# Patient Record
Sex: Female | Born: 2000 | Race: Black or African American | Hispanic: No | Marital: Single | Smoking: Never smoker
Health system: Southern US, Community
[De-identification: ages and names within clinical notes are randomized; demographics above are authoritative.]

## PROBLEM LIST (undated history)

## (undated) DIAGNOSIS — J45909 Unspecified asthma, uncomplicated: Secondary | ICD-10-CM

## (undated) DIAGNOSIS — M069 Rheumatoid arthritis, unspecified: Secondary | ICD-10-CM

---
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
## 2018-03-27 NOTE — Progress Notes (Signed)
This encounter was created in error - please disregard.

## 2018-05-09 ENCOUNTER — Other Ambulatory Visit: Payer: Self-pay

## 2018-05-09 ENCOUNTER — Telehealth (INDEPENDENT_AMBULATORY_CARE_PROVIDER_SITE_OTHER): Payer: PRIVATE HEALTH INSURANCE | Admitting: Family Medicine

## 2018-05-09 ENCOUNTER — Telehealth: Payer: Self-pay | Admitting: Family Medicine

## 2018-05-09 DIAGNOSIS — R42 Dizziness and giddiness: Secondary | ICD-10-CM | POA: Diagnosis not present

## 2018-05-09 NOTE — Progress Notes (Signed)
Spoke with pt and she informed me that her appoinment today is for her current dizziness. She states this dizziness has been present for 1 week. Did have some N/V at first when it started but no N/V currently. She also states that she has noticed some blurry vision on and off since the dizziness started.    No travel outside the country.  Please call pt at number: [phone]

## 2018-05-09 NOTE — Progress Notes (Signed)
Telemedicine Encounter- SOAP NOTE Established Patient  I discussed the limitations, risks, security and privacy concerns of performing an evaluation and management service by telephone and the availability of in person appointments. I also discussed with the patient that there may be a patient responsible charge related to this service. The patient expressed understanding and agreed to proceed.  This telephone encounter was conducted with the patient's verbal consent via audio telecommunications: yes Patient was instructed to have this encounter in a suitably private space; and to only have persons present to whom they give permission to participate. pts mother was present during the call answering questions. In addition, patient identity was confirmed by use of name plus two identifiers (DOB and address).  I spent a total of talking with the patient and pts mother  Subjective   Spoke with pt and she informed me that her appoinment today is for her current dizziness. She states this dizziness has been present for 1 week. Did have some N/V at first when it started but no N/V currently. She also states that she has noticed some blurry vision on and off since the dizziness started.   HPI pt 18yo female established pt. No recent office visits in clinic. pt has been having chest pain, nausea, vomiting, headaches, dizziness with positional changes for 2 weeks ago.  No LOC. No h/o injury. No h/o concussion.  NO cough, sneezing, or fever.  Dizziness daily with positional changes. Chest pain episodically, no n/v currently associated with dizziness.  No diarrhea.  No regular medication.  LMP-2 week ago- normal. Pt with normal appetite-eats lighter at lunch-jello and pudding but normal breakfast and dinner.  Headaches episodically -not associated with dizziness.  Pt drinks water with meals. Tea and juice drank at least once a day.  No energy drinks.  Pt does not wear glasses or contacts.  pts eyes  were checked a few years ago-glasses given for long distance-not currently wearing.  Hearing loss-tinnitus-right.  No difficulty swallowing.  No abdominal pain.  No pain with urination..  No rashes. 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  No h/o diabetes.   No travel outside the country. No recent labwork-no anemia diagnosis in the past. Mother with sickle cell trait-pt does NOT have that diagnosis. Asthma- is table. No tob use.    Past Medical History:  Diagnosis Date   Asthma    RA (rheumatoid arthritis) (HCC)     No current outpatient medications on file.   No current facility-administered medications for this visit.     No Known Allergies Live with parents-nursing school Social History   Socioeconomic History   Marital status: Single    Spouse name: Not on file   Number of children: 0   Years of education: Not on file   Highest education level: Not on file  Occupational History   Not on file  Social Needs   Financial resource strain: Not on file   Food insecurity:    Worry: Not on file    Inability: Not on file   Transportation needs:    Medical: Not on file    Non-medical: Not on file  Tobacco Use   Smoking status: Never Smoker   Smokeless tobacco: Never Used  Substance and Sexual Activity   Alcohol use: No   Drug use: No   Sexual activity: Yes    Partners: Male    Comment: 1ST intercourse- 15, partners- 2   Lifestyle   Physical  activity:    Days per week: Not on file    Minutes per session: Not on file   Stress: Not on file  Relationships   Social connections:    Talks on phone: Not on file    Gets together: Not on file    Attends religious service: Not on file    Active member of club or organization: Not on file    Attends meetings of clubs or organizations: Not on file    Relationship status: Not on file   Intimate partner violence:    Fear of current or ex partner: Not on file    Emotionally abused: Not on  file    Physically abused: Not on file    Forced sexual activity: Not on file  Other Topics Concern   Not on file  Social History Narrative   Not on file   Mother has experienced vertigo and has colitis and DM ROS  CONSTITUTIONAL: no weight loss, fever-non recently,  EENT:chronic hearing loss, no sinus problems, no nasal congestion,  CV: no chest pain currently RESP: no SOB,or  Cough GI: no heartburn, constipation, diarrhea-nausea, vomiting noted with onset of dizziness-currently none NEURO: headaches-no relationship with dizziness, blurred vision associated with dizziness, no loss of consciousness, no episodes of visual loss, no seizure symptoms Objective   Pt has not taken vital signs and does not know baseline bp/pulse-   I discussed the assessment and treatment plan with the patient. The patient was provided an opportunity to ask questions and all were answered. The patient agreed with the plan and demonstrated an understanding of the instructions. Mother asked questions about COVID and current symptoms.   The patient was advised to call back or seek an in-person evaluation if the symptoms worsen or if the condition fails to improve as anticipated.  I provided of non-face-to-face time during this encounter.  Thai Burgueno Mat Carne, MD  Primary Care at [facility] 05-09-18

## 2018-05-09 NOTE — Telephone Encounter (Signed)
Copied from CRM [id number]. Topic: Quick Communication - See Telephone Encounter >> May 09, 2018  1:34 PM Louie Bun, Rosey Bath D wrote: CRM for notification. See Telephone encounter for: 05/09/18. Patient mom Unk Pinto called and would like to talk to Dr. Leretha Pol or her CMA before the patients virtual appointment. Please call mom back, thanks.

## 2018-05-09 NOTE — Patient Instructions (Signed)
D/w pt taking vs for baseline(pt has stethoscope and bp cuff at home) and with dizziness-compare Drink at least 8oz of water 5-6 times a day Regular meals with carb +protein component Schedule a face-to -face visit for evaluation and blood work-pt with no recent evaluation. Pt and parent understand if symptoms do not improve with improved fluids and food -clinic appt needed

## 2018-05-10 NOTE — Telephone Encounter (Signed)
Saw Dr. Jonette Mate yesterday.

## 2018-05-11 ENCOUNTER — Encounter: Payer: Self-pay | Admitting: *Deleted

## 2018-05-11 ENCOUNTER — Other Ambulatory Visit: Payer: Self-pay

## 2018-05-11 ENCOUNTER — Encounter (HOSPITAL_COMMUNITY): Payer: Self-pay | Admitting: *Deleted

## 2018-05-11 ENCOUNTER — Emergency Department (HOSPITAL_COMMUNITY)
Admission: EM | Admit: 2018-05-11 | Discharge: 2018-05-11 | Disposition: A | Discharge status: Home / Self Care | Payer: BLUE CROSS/BLUE SHIELD | Attending: Emergency Medicine | Admitting: Emergency Medicine

## 2018-05-11 ENCOUNTER — Emergency Department (HOSPITAL_COMMUNITY): Payer: BLUE CROSS/BLUE SHIELD

## 2018-05-11 ENCOUNTER — Telehealth: Payer: Self-pay | Admitting: Family Medicine

## 2018-05-11 DIAGNOSIS — R42 Dizziness and giddiness: Secondary | ICD-10-CM | POA: Insufficient documentation

## 2018-05-11 DIAGNOSIS — R5381 Other malaise: Secondary | ICD-10-CM | POA: Insufficient documentation

## 2018-05-11 DIAGNOSIS — R079 Chest pain, unspecified: Secondary | ICD-10-CM | POA: Diagnosis not present

## 2018-05-11 DIAGNOSIS — J45909 Unspecified asthma, uncomplicated: Secondary | ICD-10-CM | POA: Diagnosis not present

## 2018-05-11 LAB — CBC WITH DIFFERENTIAL/PLATELET
Abs Immature Granulocytes: 0.02 10*3/uL (ref 0.00–0.07)
Basophils Absolute: 0 10*3/uL (ref 0.0–0.1)
Basophils Relative: 0 %
Eosinophils Absolute: 0.4 10*3/uL (ref 0.0–0.5)
Eosinophils Relative: 4 %
HCT: 40.9 % (ref 36.0–46.0)
Hemoglobin: 13.7 g/dL (ref 12.0–15.0)
Immature Granulocytes: 0 %
Lymphocytes Relative: 30 %
Lymphs Abs: 2.8 10*3/uL (ref 0.7–4.0)
MCH: 29.1 pg (ref 26.0–34.0)
MCHC: 33.5 g/dL (ref 30.0–36.0)
MCV: 87 fL (ref 80.0–100.0)
Monocytes Absolute: 0.9 10*3/uL (ref 0.1–1.0)
Monocytes Relative: 10 %
Neutro Abs: 5.1 10*3/uL (ref 1.7–7.7)
Neutrophils Relative %: 56 %
Platelets: 302 10*3/uL (ref 150–400)
RBC: 4.7 MIL/uL (ref 3.87–5.11)
RDW: 12.2 % (ref 11.5–15.5)
WBC: 9.2 10*3/uL (ref 4.0–10.5)
nRBC: 0 % (ref 0.0–0.2)

## 2018-05-11 LAB — BASIC METABOLIC PANEL
Anion gap: 8 (ref 5–15)
BUN: 14 mg/dL (ref 6–20)
CO2: 24 mmol/L (ref 22–32)
Calcium: 9 mg/dL (ref 8.9–10.3)
Chloride: 106 mmol/L (ref 98–111)
Creatinine, Ser: 0.71 mg/dL (ref 0.44–1.00)
GFR calc Af Amer: >60 mL/min (ref >60)
GFR calc non Af Amer: >60 mL/min (ref >60)
Glucose, Bld: 79 mg/dL (ref 70–99)
Potassium: 3.5 mmol/L (ref 3.5–5.1)
Sodium: 138 mmol/L (ref 135–145)

## 2018-05-11 LAB — POC URINE PREG, ED: Preg Test, Ur: NEGATIVE

## 2018-05-11 LAB — TROPONIN I: Troponin I: <0.03 ng/mL (ref <0.03)

## 2018-05-11 NOTE — Telephone Encounter (Signed)
This encounter was created in error - please disregard.

## 2018-05-11 NOTE — ED Triage Notes (Signed)
Fatigue & Nausea & Dizziness x 2 weeks. Has had some vomiting last week. Midline chest pain radiates to the back, describes as "aching and tight when I inhale". No cough, fevers. Has had runny.

## 2018-05-11 NOTE — Telephone Encounter (Signed)
Pt saw Dr. Judee Clara 05/09/2018, dizziness. States she was told to call back if not feeling better. Pt states dizziness remains, "The same, no worse."  States positional, worse when sitting to standing, turning head. Has BP values as instructed.  Lying 122/66  Sitting 95/64 Standing 109/77  States has been staying hydrated, eating well as instructed. Also reports fatigue, mild mid CP, intermittent. States "Same as Tuesday." Per note pt was to call back for clinic appt if symptoms remain. Pt instructed to go to ED if symptoms worsen, verbalizes understanding.  Please advise regarding appt.  CB# [phone]

## 2018-05-11 NOTE — Discharge Instructions (Addendum)
Make sure that you are getting plenty of rest and drinking a lot of fluids.  You can take Tylenol, if needed for pain.  See your doctor if not better in 1 or 2 weeks.

## 2018-05-11 NOTE — ED Provider Notes (Signed)
[facility]-EMERGENCY DEPT Provider Note   CSN: [id number] Arrival date & time: 05/11/18  1952    History   Chief Complaint Chief Complaint  Patient presents with  . Dizziness    HPI Angela Stevenson is a 18 y.o. female.     HPI   She presents for evaluation of chest discomfort felt as a "tight feeling."  Pain is been present for 2 days and is worse when she takes a deep breath.  She denies fever, chills, cough, shortness of breath, weakness or dizziness.  An episode of vomiting, 1 week ago.  No known sick contacts.  He is not currently employed.  She is not currently taking any medications.  There are no other known modifying factors.  Past Medical History:  Diagnosis Date  . Asthma   . RA (rheumatoid arthritis) [facility](HCC)     Patient Active Problem List   Diagnosis Date Noted  . Dizziness and giddiness 05/09/2018    History reviewed. No pertinent surgical history.   OB History    Gravida  0   Para  0   Term  0   Preterm  0   AB  0   Living  0     SAB  0   TAB  0   Ectopic  0   Multiple  0   Live Births  0            Home Medications    Prior to Admission medications   Not on File    Family History Family History  Problem Relation Age of Onset  . Hypertension Father   . Cancer Maternal Grandmother        lung   . Cancer Paternal Grandmother   . Cancer Paternal Grandfather     Social History Social History   Tobacco Use  . Smoking status: Never Smoker  . Smokeless tobacco: Never Used  Substance Use Topics  . Alcohol use: No  . Drug use: No     Allergies   Patient has no known allergies.   Review of Systems Review of Systems  All other systems reviewed and are negative.    Physical Exam Updated Vital Signs BP 115/68 (BP Location: Left Arm)   Pulse 67   Temp 98 F (36.7 C) (Oral)   Resp 17   Ht 5\' 7"  (1.702 m)   Wt 59.4 kg   LMP 04/27/2018   SpO2 99%   BMI 20.52 kg/m   Physical Exam Vitals  signs and nursing note reviewed.  Constitutional:      General: She is not in acute distress.    Appearance: She is well-developed. She is not ill-appearing or diaphoretic.  HENT:     Head: Normocephalic and atraumatic.     Right Ear: External ear normal.     Left Ear: External ear normal.  Eyes:     Conjunctiva/sclera: Conjunctivae normal.     Pupils: Pupils are equal, round, and reactive to light.  Neck:     Musculoskeletal: Normal range of motion and neck supple.     Trachea: Phonation normal.  Cardiovascular:     Rate and Rhythm: Normal rate and regular rhythm.     Heart sounds: Normal heart sounds.  Pulmonary:     Effort: Pulmonary effort is normal. No respiratory distress.     Breath sounds: Normal breath sounds. No stridor. No rhonchi.  Chest:     Chest wall: No tenderness.  Abdominal:  Palpations: Abdomen is soft.     Tenderness: There is no abdominal tenderness.  Musculoskeletal: Normal range of motion.  Skin:    General: Skin is warm and dry.  Neurological:     Mental Status: She is alert and oriented to person, place, and time.     Cranial Nerves: No cranial nerve deficit.     Sensory: No sensory deficit.     Motor: No abnormal muscle tone.     Coordination: Coordination normal.  Psychiatric:        Mood and Affect: Mood normal.        Behavior: Behavior normal.        Thought Content: Thought content normal.        Judgment: Judgment normal.      ED Treatments / Results  Labs (all labs ordered are listed, but only abnormal results are displayed) Labs Reviewed  BASIC METABOLIC PANEL  TROPONIN I  CBC WITH DIFFERENTIAL/PLATELET  POC URINE PREG, ED    EKG EKG Interpretation  Date/Time:  Thursday May 11 2018 20:56:08 EDT Ventricular Rate:  71 PR Interval:    QRS Duration: 76 QT Interval:  368 QTC Calculation: 400 R Axis:   72 Text Interpretation:  Sinus rhythm No old tracing to compare Confirmed by Mancel Bale [phone]) on 05/11/2018 9:24:02  PM   Radiology Dg Chest 2 View  Result Date: 05/11/2018 CLINICAL DATA:  Fatigue for 2 weeks EXAM: CHEST - 2 VIEW COMPARISON:  None. FINDINGS: The heart size and mediastinal contours are within normal limits. Both lungs are clear. The visualized skeletal structures are unremarkable. IMPRESSION: No active cardiopulmonary disease. Electronically Signed   By: Alcide Clever M.D.   On: 05/11/2018 21:36    Procedures Procedures (including critical care time)  Medications Ordered in ED Medications - No data to display   Initial Impression / Assessment and Plan / ED Course  I have reviewed the triage vital signs and the nursing notes.  Pertinent labs & imaging results that were available during my care of the patient were reviewed by me and considered in my medical decision making (see chart for details).  Clinical Course as of May 10 2299  Thu May 11, 2018  2250 Normal  POC Urine Pregnancy, ED (not at [facility]) [EW]  2250 Normal  Basic metabolic panel [EW]  2251 normal  Troponin I - Once [EW]  2251 No infiltrate or CHF, images reviewed by me  DG Chest 2 View [EW]    Clinical Course User Index [EW] Mancel Bale, MD        Patient Vitals for the past 24 hrs:  BP Temp Temp src Pulse Resp SpO2 Height Weight  05/11/18 2255 115/68 - - 67 17 99 % - -  05/11/18 2011 - - - - - - 5\' 7"  (1.702 m) 59.4 kg  05/11/18 2000 130/71 98 F (36.7 C) Oral 80 16 99 % - -    10:51 PM Reevaluation with update and discussion. After initial assessment and treatment, an updated evaluation reveals no change in clinical status, findings discussed with the patient and all questions were answered. Mancel Bale   Medical Decision Making: Chest pain, ongoing for 2 weeks.  She has a component of musculoskeletal discomfort, as the pain is worse with deep breathing and movement.  Doubt ACS, CHF, pneumonia, serious bacterial infection or metabolic instability.  CRITICAL CARE-no Performed by: Mancel Bale   Nursing Notes Reviewed/ Care Coordinated Applicable Imaging Reviewed Interpretation of Laboratory Data incorporated  into ED treatment  The patient appears reasonably screened and/or stabilized for discharge and I doubt any other medical condition or other [facility] requiring further screening, evaluation, or treatment in the ED at this time prior to discharge.  Plan: Home Medications-routine over-the-counter antipyretic; Home Treatments-heat to affected area; return here if the recommended treatment, does not improve the symptoms; Recommended follow up-asymmetric.   Final Clinical Impressions(s) / ED Diagnoses   Final diagnoses:  Nonspecific chest pain  [facility]    ED Discharge Orders    None       Mancel Bale, MD 05/11/18 [phone]

## 2018-05-12 NOTE — Telephone Encounter (Signed)
Pt was seen and evaluated in the ER yesterday 05/11/2018 for concerns.

## 2018-10-31 ENCOUNTER — Other Ambulatory Visit: Payer: Self-pay

## 2018-10-31 ENCOUNTER — Ambulatory Visit (INDEPENDENT_AMBULATORY_CARE_PROVIDER_SITE_OTHER): Payer: PRIVATE HEALTH INSURANCE

## 2018-10-31 ENCOUNTER — Ambulatory Visit (INDEPENDENT_AMBULATORY_CARE_PROVIDER_SITE_OTHER): Payer: PRIVATE HEALTH INSURANCE | Admitting: Emergency Medicine

## 2018-10-31 ENCOUNTER — Encounter: Payer: Self-pay | Admitting: Emergency Medicine

## 2018-10-31 VITALS — BP 102/62 | HR 81 | Temp 98.4°F | Resp 16 | Ht 67.0 in | Wt 149.4 lb

## 2018-10-31 DIAGNOSIS — R002 Palpitations: Secondary | ICD-10-CM

## 2018-10-31 DIAGNOSIS — Z8739 Personal history of other diseases of the musculoskeletal system and connective tissue: Secondary | ICD-10-CM

## 2018-10-31 NOTE — Patient Instructions (Addendum)
     If you have lab work done today you will be contacted with your lab results within the next 2 weeks.  If you have not heard from us then please contact us. The fastest way to get your results is to register for My Chart.   IF you received an x-ray today, you will receive an invoice from [facility]. Please contact  Radiology at [phone] with questions or concerns regarding your invoice.   IF you received labwork today, you will receive an invoice from [facility]. Please contact [facility] at [phone] with questions or concerns regarding your invoice.   Our billing staff will not be able to assist you with questions regarding bills from these companies.  You will be contacted with the lab results as soon as they are available. The fastest way to get your results is to activate your My Chart account. Instructions are located on the last page of this paperwork. If you have not heard from us regarding the results in 2 weeks, please contact this office.      Palpitations Palpitations are feelings that your heartbeat is not normal. Your heartbeat may feel like it is:  Uneven.  Faster than normal.  Fluttering.  Skipping a beat. This is usually not a serious problem. In some cases, you may need tests to rule out any serious problems. Follow these instructions at home: Pay attention to any changes in your condition. Take these actions to help manage your symptoms: Eating and drinking  Avoid: ? Coffee, tea, soft drinks, and energy drinks. ? Chocolate. ? Alcohol. ? Diet pills. Lifestyle   Try to lower your stress. These things can help you relax: ? Yoga. ? Deep breathing and meditation. ? Exercise. ? Using words and images to create positive thoughts (guided imagery). ? Using your mind to control things in your body (biofeedback).  Do not use drugs.  Get plenty of rest and sleep. Keep a regular bed time. General instructions   Take  over-the-counter and prescription medicines only as told by your doctor.  Do not use any products that contain nicotine or tobacco, such as cigarettes and e-cigarettes. If you need help quitting, ask your doctor.  Keep all follow-up visits as told by your doctor. This is important. You may need more tests if palpitations do not go away or get worse. Contact a doctor if:  Your symptoms last more than 24 hours.  Your symptoms occur more often. Get help right away if you:  Have chest pain.  Feel short of breath.  Have a very bad headache.  Feel dizzy.  Pass out (faint). Summary  Palpitations are feelings that your heartbeat is uneven or faster than normal. It may feel like your heart is fluttering or skipping a beat.  Avoid food and drinks that may cause palpitations. These include caffeine, chocolate, and alcohol.  Try to lower your stress. Do not smoke or use drugs.  Get help right away if you faint or have chest pain, shortness of breath, a severe headache, or dizziness. This information is not intended to replace advice given to you by your health care provider. Make sure you discuss any questions you have with your health care provider. Document Released: 10/21/2007 Document Revised: 02/23/2017 Document Reviewed: 02/23/2017 Elsevier Patient Education  2020 [employer]  

## 2018-10-31 NOTE — Progress Notes (Signed)
Angela Stevenson 18 y.o.   Chief Complaint  Patient presents with  . Palpitations    x 3 weeks while sitting down    HISTORY OF PRESENT ILLNESS: This is a 18 y.o. female complaining of intermittent palpitations for the past 3 weeks.  No triggered events.  Usually last about 10 seconds with a "heavy heartbeat".  No prior similar episodes.  Denies near syncope or syncopal episodes.  Denies diaphoresis, lightheadedness, dizziness, nausea or vomiting, chest pain or shortness of breath, or any other associated symptoms.  Denies recent fever, chills or flulike symptoms.  Non-smoker.  No EtOH or drug abuser.  Healthy lifestyle.  Denies pregnancy. 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  In the past she was tried on methotrexate with bad side effects. Increased stress level 7-8 in a 1-10 scale.  HPI   Prior to Admission medications   Not on File    No Known Allergies  Patient Active Problem List   Diagnosis Date Noted  . Dizziness and giddiness 05/09/2018    Past Medical History:  Diagnosis Date  . Asthma   . RA (rheumatoid arthritis) (HCC)     History reviewed. No pertinent surgical history.  Social History   Socioeconomic History  . Marital status: Single    Spouse name: Not on file  . Number of children: 0  . Years of education: Not on file  . Highest education level: Not on file  Occupational History  . Not on file  Social Needs  . Financial resource strain: Not on file  . Food insecurity    Worry: Not on file    Inability: Not on file  . Transportation needs    Medical: Not on file    Non-medical: Not on file  Tobacco Use  . Smoking status: Never Smoker  . Smokeless tobacco: Never Used  Substance and Sexual Activity  . Alcohol use: No  . Drug use: No  . Sexual activity: Yes    Partners: Male    Comment: 1ST intercourse- 15, partners- 2   Lifestyle  . Physical activity    Days per week: Not on  file    Minutes per session: Not on file  . Stress: Not on file  Relationships  . Social Musician on phone: Not on file    Gets together: Not on file    Attends religious service: Not on file    Active member of club or organization: Not on file    Attends meetings of clubs or organizations: Not on file    Relationship status: Not on file  . Intimate partner violence    Fear of current or ex partner: Not on file    Emotionally abused: Not on file    Physically abused: Not on file    Forced sexual activity: Not on file  Other Topics Concern  . Not on file  Social History Narrative  . Not on file    Family History  Problem Relation Age of Onset  . Hypertension Father   . Cancer Maternal Grandmother        lung   . Cancer Paternal Grandmother   . Cancer Paternal Grandfather      Review of Systems  Constitutional: Negative.  Negative for chills and fever.  HENT: Negative for congestion and sore throat.   Eyes: Negative.   Respiratory: Negative.  Negative for cough and shortness of breath.   Cardiovascular: Positive  for palpitations. Negative for chest pain and leg swelling.  Gastrointestinal: Negative.  Negative for abdominal pain, diarrhea, nausea and vomiting.  Genitourinary: Negative.  Negative for dysuria and hematuria.  Musculoskeletal: Negative.  Negative for myalgias.  Skin: Negative.  Negative for rash.  Neurological: Negative for dizziness, speech change, focal weakness and headaches.  Endo/Heme/Allergies: Negative.   All other systems reviewed and are negative.  Vitals:   10/31/18 1613  BP: 102/62  Pulse: 81  Resp: 16  Temp: 98.4 F (36.9 C)  SpO2: 99%     Physical Exam Vitals signs reviewed.  Constitutional:      Appearance: Normal appearance.  HENT:     Head: Normocephalic.     Right Ear: Tympanic membrane, ear canal and external ear normal.     Left Ear: Tympanic membrane, ear canal and external ear normal.  Eyes:     Extraocular  Movements: Extraocular movements intact.     Conjunctiva/sclera: Conjunctivae normal.     Pupils: Pupils are equal, round, and reactive to light.  Cardiovascular:     Rate and Rhythm: Normal rate and regular rhythm.     Pulses: Normal pulses.     Heart sounds: Normal heart sounds.  Pulmonary:     Effort: Pulmonary effort is normal.     Breath sounds: Normal breath sounds.  Musculoskeletal: Normal range of motion.     Right lower leg: No edema.     Left lower leg: No edema.  Skin:    General: Skin is warm and dry.     Capillary Refill: Capillary refill takes less than 2 seconds.  Neurological:     General: No focal deficit present.     Mental Status: She is alert and oriented to person, place, and time.     Sensory: No sensory deficit.     Motor: No weakness.  Psychiatric:        Mood and Affect: Mood normal.        Behavior: Behavior normal.   EKG: Normal sinus rhythm with ventricular rate of 77.  No acute ischemic changes.  Normal EKG. Dg Chest 2 View  Result Date: 10/31/2018 CLINICAL DATA:  Palpitations. EXAM: CHEST - 2 VIEW COMPARISON:  05/11/2018 FINDINGS: Lungs are adequately inflated and otherwise clear. Cardiomediastinal silhouette and remainder of the exam is unchanged. IMPRESSION: No active cardiopulmonary disease. Electronically Signed   By: Marin Olp M.D.   On: 10/31/2018 17:02      ASSESSMENT & PLAN: Abel was seen today for palpitations.  Diagnoses and all orders for this visit:  Palpitations -     EKG 12-Lead -     CBC with Differential/Platelet -     Comprehensive metabolic panel -     Thyroid Panel With TSH -     DG Chest 2 View; Future -     Ambulatory referral to Cardiology  History of rheumatoid arthritis Comments: Autoimmune    Patient Instructions       If you have lab work done today you will be contacted with your lab results within the next 2 weeks.  If you have not heard from [place] then please contact us. The fastest way to get your  results is to register for My Chart.   IF you received an x-ray today, you will receive an invoice from [facility]. Please contact [facility] at [phone] with questions or concerns regarding your invoice.   IF you received labwork today, you will receive an invoice from [place]. Please  contact [facility] at [phone] with questions or concerns regarding your invoice.   Our billing staff will not be able to assist you with questions regarding bills from these companies.  You will be contacted with the lab results as soon as they are available. The fastest way to get your results is to activate your My Chart account. Instructions are located on the last page of this paperwork. If you have not heard from [place] regarding the results in 2 weeks, please contact this office.     Palpitations Palpitations are feelings that your heartbeat is not normal. Your heartbeat may feel like it is:  Uneven.  Faster than normal.  Fluttering.  Skipping a beat. This is usually not a serious problem. In some cases, you may need tests to rule out any serious problems. Follow these instructions at home: Pay attention to any changes in your condition. Take these actions to help manage your symptoms: Eating and drinking  Avoid: ? Coffee, tea, soft drinks, and energy drinks. ? Chocolate. ? Alcohol. ? Diet pills. Lifestyle   Try to lower your stress. These things can help you relax: ? Yoga. ? Deep breathing and meditation. ? Exercise. ? Using words and images to create positive thoughts (guided imagery). ? Using your mind to control things in your body (biofeedback).  Do not use drugs.  Get plenty of rest and sleep. Keep a regular bed time. General instructions   Take over-the-counter and prescription medicines only as told by your doctor.  Do not use any products that contain nicotine or tobacco, such as cigarettes and e-cigarettes. If you need help quitting, ask your  doctor.  Keep all follow-up visits as told by your doctor. This is important. You may need more tests if palpitations do not go away or get worse. Contact a doctor if:  Your symptoms last more than 24 hours.  Your symptoms occur more often. Get help right away if you:  Have chest pain.  Feel short of breath.  Have a very bad headache.  Feel dizzy.  Pass out (faint). Summary  Palpitations are feelings that your heartbeat is uneven or faster than normal. It may feel like your heart is fluttering or skipping a beat.  Avoid food and drinks that may cause palpitations. These include caffeine, chocolate, and alcohol.  Try to lower your stress. Do not smoke or use drugs.  Get help right away if you faint or have chest pain, shortness of breath, a severe headache, or dizziness. This information is not intended to replace advice given to you by your health care provider. Make sure you discuss any questions you have with your health care provider. Document Released: 10/21/2007 Document Revised: 02/23/2017 Document Reviewed: 02/23/2017 Elsevier Patient Education  2020 Elsevier Inc.      Edwina Barth, MD [facility]

## 2018-11-01 ENCOUNTER — Encounter: Payer: Self-pay | Admitting: Emergency Medicine

## 2018-11-01 LAB — CBC WITH DIFFERENTIAL/PLATELET
Basophils Absolute: 0.1 10*3/uL (ref 0.0–0.2)
Basos: 1 %
EOS (ABSOLUTE): 0.3 10*3/uL (ref 0.0–0.4)
Eos: 3 %
Hematocrit: 39 % (ref 34.0–46.6)
Hemoglobin: 13.3 g/dL (ref 11.1–15.9)
Immature Grans (Abs): 0 10*3/uL (ref 0.0–0.1)
Immature Granulocytes: 0 %
Lymphocytes Absolute: 2.7 10*3/uL (ref 0.7–3.1)
Lymphs: 30 %
MCH: 29.9 pg (ref 26.6–33.0)
MCHC: 34.1 g/dL (ref 31.5–35.7)
MCV: 88 fL (ref 79–97)
Monocytes Absolute: 0.7 10*3/uL (ref 0.1–0.9)
Monocytes: 7 %
Neutrophils Absolute: 5.4 10*3/uL (ref 1.4–7.0)
Neutrophils: 59 %
Platelets: 318 10*3/uL (ref 150–450)
RBC: 4.45 x10E6/uL (ref 3.77–5.28)
RDW: 12.4 % (ref 11.7–15.4)
WBC: 9.2 10*3/uL (ref 3.4–10.8)

## 2018-11-01 LAB — COMPREHENSIVE METABOLIC PANEL
ALT: 10 IU/L (ref 0–32)
AST: 15 IU/L (ref 0–40)
Albumin/Globulin Ratio: 1.4 (ref 1.2–2.2)
Albumin: 4.2 g/dL (ref 3.9–5.0)
Alkaline Phosphatase: 53 IU/L (ref 39–117)
BUN/Creatinine Ratio: 15 (ref 9–23)
BUN: 12 mg/dL (ref 6–20)
Bilirubin Total: 0.4 mg/dL (ref 0.0–1.2)
CO2: 22 mmol/L (ref 20–29)
Calcium: 9.3 mg/dL (ref 8.7–10.2)
Chloride: 102 mmol/L (ref 96–106)
Creatinine, Ser: 0.82 mg/dL (ref 0.57–1.00)
GFR calc Af Amer: 119 mL/min/{1.73_m2} (ref >59)
GFR calc non Af Amer: 103 mL/min/{1.73_m2} (ref >59)
Globulin, Total: 3.1 g/dL (ref 1.5–4.5)
Glucose: 83 mg/dL (ref 65–99)
Potassium: 4.3 mmol/L (ref 3.5–5.2)
Sodium: 138 mmol/L (ref 134–144)
Total Protein: 7.3 g/dL (ref 6.0–8.5)

## 2018-11-01 LAB — THYROID PANEL WITH TSH
Free Thyroxine Index: 1.7 (ref 1.2–4.9)
T3 Uptake Ratio: 28 % (ref 24–39)
T4, Total: 6.1 ug/dL (ref 4.5–12.0)
TSH: 0.64 u[IU]/mL (ref 0.450–4.500)

## 2018-11-27 ENCOUNTER — Telehealth: Payer: BLUE CROSS/BLUE SHIELD | Admitting: Cardiology

## 2018-11-27 ENCOUNTER — Telehealth: Payer: Self-pay

## 2018-11-27 NOTE — Telephone Encounter (Signed)
Attempted to contact pt x 3 for virtual appointment scheduled with Dr. Harrell Gave today 11/2. Left message for pt to call back to reschedule.

## 2018-12-01 ENCOUNTER — Telehealth: Payer: Self-pay | Admitting: *Deleted

## 2018-12-01 NOTE — Telephone Encounter (Signed)
A message was left, re: her new patient appointment. 

## 2018-12-14 ENCOUNTER — Encounter: Payer: Self-pay | Admitting: General Practice

## 2018-12-16 ENCOUNTER — Other Ambulatory Visit: Payer: Self-pay

## 2018-12-16 ENCOUNTER — Encounter (HOSPITAL_COMMUNITY): Payer: Self-pay

## 2018-12-16 ENCOUNTER — Emergency Department (HOSPITAL_COMMUNITY)
Admission: EM | Admit: 2018-12-16 | Discharge: 2018-12-17 | Disposition: A | Discharge status: Home / Self Care | Payer: Self-pay | Attending: Emergency Medicine | Admitting: Emergency Medicine

## 2018-12-16 DIAGNOSIS — Z20828 Contact with and (suspected) exposure to other viral communicable diseases: Secondary | ICD-10-CM | POA: Insufficient documentation

## 2018-12-16 DIAGNOSIS — J069 Acute upper respiratory infection, unspecified: Secondary | ICD-10-CM | POA: Insufficient documentation

## 2018-12-16 DIAGNOSIS — J45909 Unspecified asthma, uncomplicated: Secondary | ICD-10-CM | POA: Insufficient documentation

## 2018-12-16 LAB — GROUP A STREP BY PCR: Group A Strep by PCR: NOT DETECTED

## 2018-12-16 NOTE — Discharge Instructions (Addendum)
Please call your primary care provider, as needed.  Please get established with a PCP if you do not already have one.  Please take Tylenol or ibuprofen as needed for your chills and body aches.  Recommend cough drops, warm tea with honey, and other conservative methods for symptomatic relief of your sore throat.   If you live with, or provide care at home for, a person confirmed to have, or being evaluated for, COVID-19 infection please follow these guidelines to prevent infection:  Follow healthcare providers instructions Make sure that you understand and can help the patient follow any healthcare provider instructions for all care.  Provide for the patients basic needs You should help the patient with basic needs in the home and provide support for getting groceries, prescriptions, and other personal needs.  Monitor the patients symptoms If they are getting sicker, call his or her medical provider a  This will help the healthcare providers office take steps to keep other people from getting infected. Ask the healthcare provider to call the local or state health department.  Limit the number of people who have contact with the patient If possible, have only one caregiver for the patient. Other household members should stay in another home or place of residence. If this is not possible, they should stay in another room, or be separated from the patient as much as possible. Use a separate bathroom, if available. Restrict visitors who do not have an essential need to be in the home.  Keep older adults, very young children, and other sick people away from the patient Keep older adults, very young children, and those who have compromised immune systems or chronic health conditions away from the patient. This includes people with chronic heart, lung, or kidney conditions, diabetes, and cancer.  Ensure good ventilation Make sure that shared spaces in the home have good air flow, such as  from an air conditioner or an opened window, weather permitting.  Wash your hands often Wash your hands often and thoroughly with soap and water for at least 20 seconds. You can use an alcohol based hand sanitizer if soap and water are not available and if your hands are not visibly dirty. Avoid touching your eyes, nose, and mouth with unwashed hands. Use disposable paper towels to dry your hands. If not available, use dedicated cloth towels and replace them when they become wet.  Wear a facemask and gloves Wear a disposable facemask at all times in the room and gloves when you touch or have contact with the patients blood, body fluids, and/or secretions or excretions, such as sweat, saliva, sputum, nasal mucus, vomit, urine, or feces.  Ensure the mask fits over your nose and mouth tightly, and do not touch it during use. Throw out disposable facemasks and gloves after using them. Do not reuse. Wash your hands immediately after removing your facemask and gloves. If your personal clothing becomes contaminated, carefully remove clothing and launder. Wash your hands after handling contaminated clothing. Place all used disposable facemasks, gloves, and other waste in a lined container before disposing them with other household waste. Remove gloves and wash your hands immediately after handling these items.  Do not share dishes, glasses, or other household items with the patient Avoid sharing household items. You should not share dishes, drinking glasses, cups, eating utensils, towels, bedding, or other items After the person uses these items, you should wash them thoroughly with soap and water.  Wash laundry thoroughly Immediately remove and wash clothes  or bedding that have blood, body fluids, and/or secretions or excretions, such as sweat, saliva, sputum, nasal mucus, vomit, urine, or feces, on them. Wear gloves when handling laundry from the patient. Read and follow directions on labels of  laundry or clothing items and detergent. In general, wash and dry with the warmest temperatures recommended on the label.  Clean all areas the individual has used often Clean all touchable surfaces, such as counters, tabletops, doorknobs, bathroom fixtures, toilets, phones, keyboards, tablets, and bedside tables, every day. Also, clean any surfaces that may have blood, body fluids, and/or secretions or excretions on them. Wear gloves when cleaning surfaces the patient has come in contact with. Use a diluted bleach solution (e.g., dilute bleach with 1 part bleach and 10 parts water) or a household disinfectant with a label that says EPA-registered for coronaviruses. To make a bleach solution at home, add 1 tablespoon of bleach to 1 quart (4 cups) of water. For a larger supply, add  cup of bleach to 1 gallon (16 cups) of water. Read labels of cleaning products and follow recommendations provided on product labels. Labels contain instructions for safe and effective use of the cleaning product including precautions you should take when applying the product, such as wearing gloves or eye protection and making sure you have good ventilation during use of the product. Remove gloves and wash hands immediately after cleaning.  Monitor yourself for signs and symptoms of illness Caregivers and household members are considered close contacts, should monitor their health, and will be asked to limit movement outside of the home to the extent possible. Follow the monitoring steps for close contacts listed on the symptom monitoring form.   ? If you have additional questions, contact your local health department or call the epidemiologist on call at [phone] (available 24/7). ? This guidance is subject to change. For the most up-to-date guidance from [facility], please refer to their website: [email or web address]hu

## 2018-12-16 NOTE — ED Provider Notes (Signed)
[facility]-EMERGENCY DEPT Provider Note   CSN: [id number] Arrival date & time: 12/16/18  1804     History   Chief Complaint Chief Complaint  Patient presents with   Generalized Body Aches   Headache    HPI Carlise Florio is a 18 y.o. female with no relevant PMH presents to the ED with a 3-day history of chills, body aches, headache, runny nose, and sore throat.  She denies any shortness of breath or cough, chest pain, diminished appetite, inability to tolerate p.o., dizziness, hearing loss, ear pain, purulent nasal drainage, abdominal pain, nausea or vomiting, change in bowel habits, urinary symptoms, blurred vision, IVDA, numbness or tingling, or other neurologic deficits.  She has taken Tylenol which is helped with her headache.  She describes it as an insidious onset, roughly 6 out of 10 discomfort.  She is concerned primarily for COVID-19, but denies any obvious sick contacts.  She recently finished school and is not currently working.       HPI  Past Medical History:  Diagnosis Date   Asthma    RA (rheumatoid arthritis) (HCC)     Patient Active Problem List   Diagnosis Date Noted   History of rheumatoid arthritis 10/31/2018   Dizziness and giddiness 05/09/2018    History reviewed. No pertinent surgical history.   OB History    Gravida  0   Para  0   Term  0   Preterm  0   AB  0   Living  0     SAB  0   TAB  0   Ectopic  0   Multiple  0   Live Births  0            Home Medications    Prior to Admission medications   Medication Sig Start Date End Date Taking? Authorizing Provider  acetaminophen (TYLENOL) 325 MG tablet Take 650 mg by mouth every 6 (six) hours as needed for moderate pain.   Yes [provider]  Elderberry 575 MG/5ML SYRP Take 10 mLs by mouth once.   Yes [provider]  hydrocortisone cream 1 % Apply 1 application topically 2 (two) times daily as needed for itching.   Yes [provider]    Family History Family History  Problem Relation Age of Onset   Hypertension Father    Cancer Maternal Grandmother        lung    Cancer Paternal Grandmother    Cancer Paternal Grandfather     Social History Social History   Tobacco Use   Smoking status: Never Smoker   Smokeless tobacco: Never Used  Substance Use Topics   Alcohol use: No   Drug use: No     Allergies   Patient has no known allergies.   Review of Systems Review of Systems  All other systems reviewed and are negative.    Physical Exam Updated Vital Signs BP 125/79    Pulse 89    Temp 98.3 F (36.8 C) (Oral)    Resp 17    Ht 5\' 7"  (1.702 m)    Wt 65.8 kg    LMP 12/02/2018 (Approximate)    SpO2 100%    BMI 22.71 kg/m   Physical Exam Vitals signs and nursing note reviewed. Exam conducted with a chaperone present.  Constitutional:      Appearance: Normal appearance.  HENT:     Head: Normocephalic and atraumatic.     Comments: Sinuses nontender  to palpation.  Headache not exacerbated by leaning forward.    Mouth/Throat:     Comments: Patent oropharynx.  Mildly erythematous with clear drainage.  No tonsillar hypertrophy or exudates noted on exam.  No uvular deviation.  No soft palate or tongue swelling.  No masses appreciated. Eyes:     General: No scleral icterus.    Extraocular Movements: Extraocular movements intact.     Conjunctiva/sclera: Conjunctivae normal.     Pupils: Pupils are equal, round, and reactive to light.  Neck:     Musculoskeletal: Normal range of motion and neck supple. No neck rigidity or muscular tenderness.     Comments: No meningismus. Cardiovascular:     Rate and Rhythm: Normal rate and regular rhythm.     Pulses: Normal pulses.     Heart sounds: Normal heart sounds.  Pulmonary:     Effort: Pulmonary effort is normal. No respiratory distress.     Breath sounds: Normal breath sounds. No wheezing or rales.  Skin:    General: Skin is dry.    Neurological:     General: No focal deficit present.     Mental Status: She is alert and oriented to person, place, and time.     GCS: GCS eye subscore is 4. GCS verbal subscore is 5. GCS motor subscore is 6.     Cranial Nerves: No cranial nerve deficit.     Sensory: No sensory deficit.     Motor: No weakness.     Coordination: Coordination normal.     Gait: Gait normal.  Psychiatric:        Mood and Affect: Mood normal.        Behavior: Behavior normal.        Thought Content: Thought content normal.      ED Treatments / Results  Labs (all labs ordered are listed, but only abnormal results are displayed) Labs Reviewed  GROUP A STREP BY PCR  NOVEL CORONAVIRUS, NAA (HOSP ORDER, SEND-OUT TO REF LAB; TAT 18-24 HRS)    EKG None  Radiology No results found.  Procedures Procedures (including critical care time)  Medications Ordered in ED Medications - No data to display   Initial Impression / Assessment and Plan / ED Course  I have reviewed the triage vital signs and the nursing notes.  Pertinent labs & imaging results that were available during my care of the patient were reviewed by me and considered in my medical decision making (see chart for details).       Despite relatively benign oropharynx on physical exam, will obtain strep PCR testing to rule out bacterial pharyngitis.  She denies any decreased p.o. intake or difficulty swallowing as well as any abdominal discomfort, nausea, or vomiting.  She did however report that the sore throat preceded the runny nose and headache symptoms.  She has not taken any for her sore throat symptoms besides Tylenol.  Recommend that she try ibuprofen, cough drops, warm tea with honey, and other conservative methods for symptomatic relief.  Her headache is not concerning for [facility], ICH, Meningitis, or temporal arteritis. Pt is afebrile with no focal neuro deficits, nuchal rigidity, or change in vision.  Her headache has been slowly  progressive thunderclap presentation.  Not the worst headache of her life.  CT head not warranted.  Given her collection of symptoms, will also obtain COVID-19 testing.  She reports that is the primary reason for why she is here in the ED today for evaluation.  Encouraged her  to isolate while results are pending.  She lives with her parents, who has been asymptomatic.  Patient's history and physical exam is consistent with an upper respiratory infection, likely viral etiology.  Chest x-ray not necessary due to brevity of illness, lack of chest pain or shortness of breath symptoms, lack of cough, and afebrile.  Provided patient with strict return precautions including but not limited to fever chills uncontrolled with Tylenol or ibuprofen, respiratory difficulty, intractable nausea and vomiting, or any other new or worsening symptoms.    Angela Stevenson was evaluated in Emergency Department on 12/16/2018 for the symptoms described in the history of present illness. She was evaluated in the context of the global COVID-19 pandemic, which necessitated consideration that the patient might be at risk for infection with the SARS-CoV-2 virus that causes COVID-19. Institutional protocols and algorithms that pertain to the evaluation of patients at risk for COVID-19 are in a state of rapid change based on information released by regulatory bodies including the CDC and federal and state organizations. These policies and algorithms were followed during the patient's care in the ED.   Final Clinical Impressions(s) / ED Diagnoses   Final diagnoses:  Viral upper respiratory tract infection    ED Discharge Orders    None       Lorelee New, PA-C 12/16/18 2343    Cathren Laine, MD 12/17/18 [phone]

## 2018-12-16 NOTE — ED Triage Notes (Signed)
Pt presents with c/o body aches and a headache for several days. Pt also c/o nasal congestion.

## 2018-12-18 LAB — NOVEL CORONAVIRUS, NAA (HOSP ORDER, SEND-OUT TO REF LAB; TAT 18-24 HRS): SARS-CoV-2, NAA: NOT DETECTED

## 2018-12-26 ENCOUNTER — Ambulatory Visit: Payer: PRIVATE HEALTH INSURANCE | Admitting: Family Medicine

## 2019-03-27 ENCOUNTER — Other Ambulatory Visit: Payer: Self-pay

## 2019-03-29 ENCOUNTER — Other Ambulatory Visit: Payer: Self-pay

## 2019-03-29 ENCOUNTER — Ambulatory Visit (INDEPENDENT_AMBULATORY_CARE_PROVIDER_SITE_OTHER): Payer: PRIVATE HEALTH INSURANCE | Admitting: Obstetrics & Gynecology

## 2019-03-29 ENCOUNTER — Encounter: Payer: Self-pay | Admitting: Obstetrics & Gynecology

## 2019-03-29 VITALS — BP 118/70 | Ht 68.0 in | Wt 148.0 lb

## 2019-03-29 DIAGNOSIS — Z30011 Encounter for initial prescription of contraceptive pills: Secondary | ICD-10-CM | POA: Diagnosis not present

## 2019-03-29 DIAGNOSIS — Z113 Encounter for screening for infections with a predominantly sexual mode of transmission: Secondary | ICD-10-CM | POA: Diagnosis not present

## 2019-03-29 DIAGNOSIS — Z01419 Encounter for gynecological examination (general) (routine) without abnormal findings: Secondary | ICD-10-CM

## 2019-03-29 MED ORDER — NORETHIN ACE-ETH ESTRAD-FE 1-20 MG-MCG PO TABS: 1.0000 | ORAL_TABLET | Freq: Every day | ORAL | 4 refills | Status: DC

## 2019-03-29 NOTE — Progress Notes (Signed)
[place] 2000/12/14 [id number]   History:    19 y.o. G0 Single.  New recent boyfriend.  Graduated in Nursing.  RP:  Established patient presenting for annual gyn exam   HPI:  Menses regular normal.  No BTB.  No dysmenorrhea.  No pelvic pain.  No abnormal vaginal d/c.  No pain with IC.  Not using condoms.  Would like to start on BCPs.  Breasts wnl.  Mictions/BMs wnl.  BMI 22.5.   Past medical history,surgical history, family history and social history were all reviewed and documented in the EPIC chart.  Gynecologic History Patient's last menstrual period was 02/28/2019.  Obstetric History OB History  Gravida Para Term Preterm AB Living  0 0 0 0 0 0  SAB TAB Ectopic Multiple Live Births  0 0 0 0 0     ROS: A ROS was performed and pertinent positives and negatives are included in the history.  GENERAL: No fevers or chills. HEENT: No change in vision, no earache, sore throat or sinus congestion. NECK: No pain or stiffness. CARDIOVASCULAR: No chest pain or pressure. No palpitations. PULMONARY: No shortness of breath, cough or wheeze. GASTROINTESTINAL: No abdominal pain, nausea, vomiting or diarrhea, melena or bright red blood per rectum. GENITOURINARY: No urinary frequency, urgency, hesitancy or dysuria. MUSCULOSKELETAL: No joint or muscle pain, no back pain, no recent trauma. DERMATOLOGIC: No rash, no itching, no lesions. ENDOCRINE: No polyuria, polydipsia, no heat or cold intolerance. No recent change in weight. HEMATOLOGICAL: No anemia or easy bruising or bleeding. NEUROLOGIC: No headache, seizures, numbness, tingling or weakness. PSYCHIATRIC: No depression, no loss of interest in normal activity or change in sleep pattern.     Exam:   BP 118/70   Ht 5\' 8"  (1.727 m)   Wt 148 lb (67.1 kg)   LMP 02/28/2019   BMI 22.50 kg/m   Body mass index is 22.5 kg/m.  General appearance : Well developed well nourished female. No acute distress HEENT: Eyes: no retinal hemorrhage or  exudates,  Neck supple, trachea midline, no carotid bruits, no thyroidmegaly Lungs: Clear to auscultation, no rhonchi or wheezes, or rib retractions  Heart: Regular rate and rhythm, no murmurs or gallops Breast:Examined in sitting and supine position were symmetrical in appearance, no palpable masses or tenderness,  no skin retraction, no nipple inversion, no nipple discharge, no skin discoloration, no axillary or supraclavicular lymphadenopathy Abdomen: no palpable masses or tenderness, no rebound or guarding Extremities: no edema or skin discoloration or tenderness  Pelvic: Vulva: Normal             Vagina: No gross lesions or discharge  Cervix: No gross lesions or discharge.  Pap reflex done.  Gono-Chlam done.  Uterus  AV, normal size, shape and consistency, non-tender and mobile  Adnexa  Without masses or tenderness  Anus: Normal   Assessment/Plan:  19 y.o. female for annual exam   1. Encounter for routine gynecological examination with Papanicolaou smear of cervix Normal gynecologic exam.  Pap reflex done.  Breast exam normal.  Good body mass index at 22.5.  Continue with fitness and healthy nutrition.  2. Encounter for initial prescription of contraceptive pills Counseling on contraception.  Decision to start on birth control pills.  No contraindication.  Usage, risks and benefits reviewed.  The generic of Loestrin Fe 1/20 prescribed.  3. Screen for STD (sexually transmitted disease) Condom use recommended. - Gono-Chlam on Pap - HIV antibody (with reflex) - RPR - Hepatitis C Antibody - Hepatitis  B Surface AntiGEN  Other orders - norethindrone-ethinyl estradiol (LOESTRIN FE) 1-20 MG-MCG tablet; Take 1 tablet by mouth daily.  [name] MD, 3:38 PM 03/29/2019

## 2019-03-30 LAB — RPR: RPR Ser Ql: NONREACTIVE

## 2019-03-30 LAB — HEPATITIS B SURFACE ANTIGEN: Hepatitis B Surface Ag: NONREACTIVE

## 2019-03-30 LAB — HEPATITIS C ANTIBODY
Hepatitis C Ab: NONREACTIVE
SIGNAL TO CUT-OFF: 0.02 (ref <1.00)

## 2019-03-30 LAB — HIV ANTIBODY (ROUTINE TESTING W REFLEX): HIV 1&2 Ab, 4th Generation: NONREACTIVE

## 2019-04-04 ENCOUNTER — Encounter: Payer: Self-pay | Admitting: Obstetrics & Gynecology

## 2019-04-04 LAB — PAP THINPREP ASCUS RFLX HPV RFLX TYPE
C. trachomatis RNA, TMA: NOT DETECTED
N. gonorrhoeae RNA, TMA: NOT DETECTED

## 2019-04-04 LAB — HUMAN PAPILLOMAVIRUS, HIGH RISK: HPV DNA High Risk: DETECTED — AB

## 2019-04-04 NOTE — Patient Instructions (Signed)
1. Encounter for routine gynecological examination with Papanicolaou smear of cervix Normal gynecologic exam.  Pap reflex done.  Breast exam normal.  Good body mass index at 22.5.  Continue with fitness and healthy nutrition.  2. Encounter for initial prescription of contraceptive pills Counseling on contraception.  Decision to start on birth control pills.  No contraindication.  Usage, risks and benefits reviewed.  The generic of Loestrin Fe 1/20 prescribed.  3. Screen for STD (sexually transmitted disease) Condom use recommended. - Gono-Chlam on Pap - HIV antibody (with reflex) - RPR - Hepatitis C Antibody - Hepatitis B Surface AntiGEN  Other orders - norethindrone-ethinyl estradiol (LOESTRIN FE) 1-20 MG-MCG tablet; Take 1 tablet by mouth daily.  Angela Stevenson, it was a pleasure seeing you today!  I will inform you of your results as soon as they are available.

## 2019-04-18 ENCOUNTER — Encounter: Payer: PRIVATE HEALTH INSURANCE | Admitting: Obstetrics & Gynecology

## 2019-05-02 ENCOUNTER — Ambulatory Visit: Payer: PRIVATE HEALTH INSURANCE | Admitting: Obstetrics & Gynecology

## 2019-05-15 ENCOUNTER — Encounter: Payer: PRIVATE HEALTH INSURANCE | Admitting: Obstetrics & Gynecology

## 2019-07-31 ENCOUNTER — Encounter: Payer: Self-pay | Admitting: Obstetrics & Gynecology

## 2019-07-31 ENCOUNTER — Ambulatory Visit: Payer: PRIVATE HEALTH INSURANCE | Admitting: Obstetrics & Gynecology

## 2019-07-31 ENCOUNTER — Other Ambulatory Visit: Payer: Self-pay

## 2019-07-31 VITALS — BP 112/70

## 2019-07-31 DIAGNOSIS — Z30011 Encounter for initial prescription of contraceptive pills: Secondary | ICD-10-CM | POA: Diagnosis not present

## 2019-07-31 DIAGNOSIS — R87810 Cervical high risk human papillomavirus (HPV) DNA test positive: Secondary | ICD-10-CM | POA: Diagnosis not present

## 2019-07-31 DIAGNOSIS — R87610 Atypical squamous cells of undetermined significance on cytologic smear of cervix (ASC-US): Secondary | ICD-10-CM

## 2019-07-31 DIAGNOSIS — N870 Mild cervical dysplasia: Secondary | ICD-10-CM | POA: Diagnosis not present

## 2019-07-31 MED ORDER — NORETHIN ACE-ETH ESTRAD-FE 1-20 MG-MCG PO TABS: 1.0000 | ORAL_TABLET | Freq: Every day | ORAL | 4 refills | Status: DC

## 2019-07-31 NOTE — Progress Notes (Signed)
    Angela Stevenson [birth year] [id number]        19 y.o.  G0P0000 Single  RP: ASCUS/HPV HR positive for Colposcopy  HPI: Pap 03/30/2019 ASCUS/HPV HR positive.  Gono-Chlam Negative 03/30/2019.  Using condoms for contraception, but had to take the morning after pill 2 wks ago, had a withdrawal bleeding after.   OB History  Gravida Para Term Preterm AB Living  0 0 0 0 0 0  SAB TAB Ectopic Multiple Live Births  0 0 0 0 0    Past medical history,surgical history, problem list, medications, allergies, family history and social history were all reviewed and documented in the EPIC chart.   Directed ROS with pertinent positives and negatives documented in the history of present illness/assessment and plan.  Exam:  There were no vitals filed for this visit. General appearance:  Normal  Colposcopy Procedure Note Angela Stevenson 07/31/2019  Indications:  ASCUS/HPV HR positive  Procedure Details  The risks and benefits of the procedure and Verbal informed consent obtained.  Speculum placed in vagina and excellent visualization of cervix achieved, cervix swabbed x 3 with acetic acid solution.  Findings:  Cervix colposcopy: Physical Exam Genitourinary:       Vaginal colposcopy: Normal  Vulvar colposcopy: Normal  Perirectal colposcopy: Normal  The cervix was sprayed with Hurricane before performing the cervical biopsies.  Specimens: HPV 16-18-45.  Cervical Bxs 12 and 6 O'Clock  Complications: None, good hemostasis with Silver Nitrate . Plan:  Management per HPV 16-18-45 and Cervical Bx results  UPT Negative   Assessment/Plan:  19 y.o. G0P0000   1. 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  Colposcopy procedure explained to patient.  Colposcopy findings reviewed.  We will manage according to results of HPV 16-18-45 and per cervical biopsy results.  Postprocedure precautions reviewed. - Pathology Report ([facility]) - HPV Type 16 and 18/45  RNA  2. Encounter for initial prescription of contraceptive pills Patient had to take the morning after pill 2 weeks ago, had a withdrawal bleeding after.  Urine pregnancy test negative today.  Will start on birth control pills now.  Benefits and risks reviewed.  Usage reviewed and prescription sent to pharmacy. - Pregnancy, urine  Other orders - norethindrone-ethinyl estradiol (LOESTRIN FE) 1-20 MG-MCG tablet; Take 1 tablet by mouth daily.  [name] MD, 11:56 AM 07/31/2019

## 2019-08-01 ENCOUNTER — Encounter: Payer: Self-pay | Admitting: Obstetrics & Gynecology

## 2019-08-01 LAB — PREGNANCY, URINE: Preg Test, Ur: NEGATIVE

## 2019-08-01 LAB — HPV TYPE 16 AND 18/45 RNA
HPV Type 16 RNA: NOT DETECTED
HPV Type 18/45 RNA: NOT DETECTED

## 2019-08-01 NOTE — Patient Instructions (Signed)
1. 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  Colposcopy procedure explained to patient.  Colposcopy findings reviewed.  We will manage according to results of HPV 16-18-45 and per cervical biopsy results.  Postprocedure precautions reviewed. - Pathology Report ([facility]) - HPV Type 16 and 18/45 RNA  2. Encounter for initial prescription of contraceptive pills Patient had to take the morning after pill 2 weeks ago, had a withdrawal bleeding after.  Urine pregnancy test negative today.  Will start on birth control pills now.  Benefits and risks reviewed.  Usage reviewed and prescription sent to pharmacy. - Pregnancy, urine  Other orders - norethindrone-ethinyl estradiol (LOESTRIN FE) 1-20 MG-MCG tablet; Take 1 tablet by mouth daily.  Angela Stevenson, it was a pleasure seeing you today!  I will inform you of your results as soon as they are available.

## 2019-08-02 LAB — PATHOLOGY REPORT

## 2019-08-02 LAB — TISSUE PATH REPORT

## 2019-10-18 ENCOUNTER — Ambulatory Visit: Payer: PRIVATE HEALTH INSURANCE | Admitting: Obstetrics and Gynecology

## 2019-10-18 ENCOUNTER — Other Ambulatory Visit: Payer: Self-pay

## 2019-10-18 ENCOUNTER — Encounter: Payer: Self-pay | Admitting: Obstetrics and Gynecology

## 2019-10-18 VITALS — BP 106/70

## 2019-10-18 DIAGNOSIS — N3001 Acute cystitis with hematuria: Secondary | ICD-10-CM | POA: Diagnosis not present

## 2019-10-18 DIAGNOSIS — R300 Dysuria: Secondary | ICD-10-CM

## 2019-10-18 MED ORDER — PHENAZOPYRIDINE HCL 200 MG PO TABS: 200.0000 mg | ORAL_TABLET | Freq: Three times a day (TID) | ORAL | 0 refills | Status: AC | PRN

## 2019-10-18 MED ORDER — SULFAMETHOXAZOLE-TRIMETHOPRIM 800-160 MG PO TABS: 1.0000 | ORAL_TABLET | Freq: Two times a day (BID) | ORAL | 0 refills | Status: AC

## 2019-10-18 NOTE — Progress Notes (Signed)
   Angela Stevenson 2000/03/07 [id number]  SUBJECTIVE:  19 y.o. G0P0000 female presents for evaluation of urinary symptoms. Past few days she has had a cloudy urine with increasing dysuria, urinary frequency, and malodorous urine.  Just started her period today.  Had a UTI in the past with similar symptoms.  Current Outpatient Medications  Medication Sig Dispense Refill  . norethindrone-ethinyl estradiol (LOESTRIN FE) 1-20 MG-MCG tablet Take 1 tablet by mouth daily. (Patient not taking: Reported on 10/18/2019) 84 tablet 4   No current facility-administered medications for this visit.   Allergies: Patient has no known allergies.  Patient's last menstrual period was 10/18/2019.  Past medical history,surgical history, problem list, medications, allergies, family history and social history were all reviewed and documented as reviewed in the EPIC chart.  ROS: Pertinent positives and negatives as reviewed in HPI.   OBJECTIVE:  BP 106/70   LMP 10/18/2019  The patient appears well, alert, oriented, in no distress.  Urinalysis: 40-60 WBC, 20-40 RBC, 6-10 squamous epithelial cells/hpf, 3+ leukocyte esterase, negative nitrite  ASSESSMENT:  19 y.o. G0P0000 here with acute cystitis  PLAN:  Send urine for culture Rx sent for Bactrim DS twice daily x5 days and Pyridium 200 mg 3 times daily as needed for more immediate relief   Theresia Majors MD 10/18/19

## 2019-10-21 LAB — URINE CULTURE
MICRO NUMBER:: 10987586
SPECIMEN QUALITY:: ADEQUATE

## 2019-10-21 LAB — URINALYSIS, COMPLETE W/RFL CULTURE
Bilirubin Urine: NEGATIVE
Glucose, UA: NEGATIVE
Hyaline Cast: NONE SEEN /LPF
Ketones, ur: NEGATIVE
Nitrites, Initial: NEGATIVE
Protein, ur: NEGATIVE
Specific Gravity, Urine: 1.015 (ref 1.001–1.03)
pH: 7 (ref 5.0–8.0)

## 2019-10-21 LAB — CULTURE INDICATED

## 2020-01-17 ENCOUNTER — Encounter: Payer: Self-pay | Admitting: Family Medicine

## 2020-01-17 ENCOUNTER — Telehealth (INDEPENDENT_AMBULATORY_CARE_PROVIDER_SITE_OTHER): Payer: PRIVATE HEALTH INSURANCE | Admitting: Family Medicine

## 2020-01-17 DIAGNOSIS — T7840XA Allergy, unspecified, initial encounter: Secondary | ICD-10-CM | POA: Diagnosis not present

## 2020-01-17 DIAGNOSIS — R059 Cough, unspecified: Secondary | ICD-10-CM | POA: Diagnosis not present

## 2020-01-17 DIAGNOSIS — J45909 Unspecified asthma, uncomplicated: Secondary | ICD-10-CM | POA: Diagnosis not present

## 2020-01-17 MED ORDER — ALBUTEROL SULFATE HFA 108 (90 BASE) MCG/ACT IN AERS: 2.0000 | INHALATION_SPRAY | Freq: Four times a day (QID) | RESPIRATORY_TRACT | 2 refills | Status: DC | PRN

## 2020-01-17 MED ORDER — CETIRIZINE HCL 10 MG PO TABS: 10.0000 mg | ORAL_TABLET | Freq: Every day | ORAL | 3 refills | Status: DC

## 2020-01-17 MED ORDER — FLUTICASONE PROPIONATE 50 MCG/ACT NA SUSP: 2.0000 | Freq: Every day | NASAL | 6 refills | Status: DC

## 2020-01-17 NOTE — Patient Instructions (Addendum)
Asthma, Adult  Asthma is a long-term (chronic) condition in which the airways get tight and narrow. The airways are the breathing passages that lead from the nose and mouth down into the lungs. A person with asthma will have times when symptoms get worse. These are called asthma attacks. They can cause coughing, whistling sounds when you breathe (wheezing), shortness of breath, and chest pain. They can make it hard to breathe. There is no cure for asthma, but medicines and lifestyle changes can help control it. There are many things that can bring on an asthma attack or make asthma symptoms worse (triggers). Common triggers include:  Mold.  Dust.  Cigarette smoke.  Cockroaches.  Things that can cause allergy symptoms (allergens). These include animal skin flakes (dander) and pollen from trees or grass.  Things that pollute the air. These may include household cleaners, wood smoke, smog, or chemical odors.  Cold air, weather changes, and wind.  Crying or laughing hard.  Stress.  Certain medicines or drugs.  Certain foods such as dried fruit, potato chips, and grape juice.  Infections, such as a cold or the flu.  Certain medical conditions or diseases.  Exercise or tiring activities. Asthma may be treated with medicines and by staying away from the things that cause asthma attacks. Types of medicines may include:  Controller medicines. These help prevent asthma symptoms. They are usually taken every day.  Fast-acting reliever or rescue medicines. These quickly relieve asthma symptoms. They are used as needed and provide short-term relief.  Allergy medicines if your attacks are brought on by allergens.  Medicines to help control the body's defense (immune) system. Follow these instructions at home: Avoiding triggers in your home  Change your heating and air conditioning filter often.  Limit your use of fireplaces and wood stoves.  Get rid of pests (such as roaches and  mice) and their droppings.  Throw away plants if you see mold on them.  Clean your floors. Dust regularly. Use cleaning products that do not smell.  Have someone vacuum when you are not home. Use a vacuum cleaner with a HEPA filter if possible.  Replace carpet with wood, tile, or vinyl flooring. Carpet can trap animal skin flakes and dust.  Use allergy-proof pillows, mattress covers, and box spring covers.  Wash bed sheets and blankets every week in hot water. Dry them in a dryer.  Keep your bedroom free of any triggers.  Avoid pets and keep windows closed when things that cause allergy symptoms are in the air.  Use blankets that are made of polyester or cotton.  Clean bathrooms and kitchens with bleach. If possible, have someone repaint the walls in these rooms with mold-resistant paint. Keep out of the rooms that are being cleaned and painted.  Wash your hands often with soap and water. If soap and water are not available, use hand sanitizer.  Do not allow anyone to smoke in your home. General instructions  Take over-the-counter and prescription medicines only as told by your doctor. ? Talk with your doctor if you have questions about how or when to take your medicines. ? Make note if you need to use your medicines more often than usual.  Do not use any products that contain nicotine or tobacco, such as cigarettes and e-cigarettes. If you need help quitting, ask your doctor.  Stay away from secondhand smoke.  Avoid doing things outdoors when allergen counts are high and when air quality is low.  Wear a ski mask   when doing outdoor activities in the winter. The mask should cover your nose and mouth. Exercise indoors on cold days if you can.  Warm up before you exercise. Take time to cool down after exercise.  Use a peak flow meter as told by your doctor. A peak flow meter is a tool that measures how well the lungs are working.  Keep track of the peak flow meter's readings.  Write them down.  Follow your asthma action plan. This is a written plan for taking care of your asthma and treating your attacks.  Make sure you get all the shots (vaccines) that your doctor recommends. Ask your doctor about a flu shot and a pneumonia shot.  Keep all follow-up visits as told by your doctor. This is important. Contact a doctor if:  You have wheezing, shortness of breath, or a cough even while taking medicine to prevent attacks.  The mucus you cough up (sputum) is thicker than usual.  The mucus you cough up changes from clear or white to yellow, green, gray, or bloody.  You have problems from the medicine you are taking, such as: ? A rash. ? Itching. ? Swelling. ? Trouble breathing.  You need reliever medicines more than 2-3 times a week.  Your peak flow reading is still at 50-79% of your personal best after following the action plan for 1 hour.  You have a fever. Get help right away if:  You seem to be worse and are not responding to medicine during an asthma attack.  You are short of breath even at rest.  You get short of breath when doing very little activity.  You have trouble eating, drinking, or talking.  You have chest pain or tightness.  You have a fast heartbeat.  Your lips or fingernails start to turn blue.  You are light-headed or dizzy, or you faint.  Your peak flow is less than 50% of your personal best.  You feel too tired to breathe normally. Summary  Asthma is a long-term (chronic) condition in which the airways get tight and narrow. An asthma attack can make it hard to breathe.  Asthma cannot be cured, but medicines and lifestyle changes can help control it.  Make sure you understand how to avoid triggers and how and when to use your medicines. This information is not intended to replace advice given to you by your health care provider. Make sure you discuss any questions you have with your health care provider. Document Revised:  03/16/2018 Document Reviewed: 02/16/2016 Elsevier Patient Education  2020 Elsevier Inc. Allergies, Adult An allergy means that your body reacts to something that bothers it (allergen). It is not a normal reaction. This can happen from something that you:  Eat.  Breathe in.  Touch. You can have an allergy (be allergic) to:  Outdoor things, like: ? Pollen. ? Grass. ? Weeds.  Indoor things, like: ? Dust. ? Smoke. ? Pet dander.  Foods.  Medicines.  Things that bother your skin, like: ? Detergents. ? Chemicals. ? Latex.  Perfume.  Bugs. An allergy cannot spread from person to person (is not contagious). Follow these instructions at home:         Stay away from things that you know you are allergic to.  If you have allergies to things in the air, wash out your nose each day. Do it with one of these: ? A salt-water (saline) spray. ? A container (neti pot).  Take over-the-counter and prescription medicines only as  told by your doctor.  Keep all follow-up visits as told by your doctor. This is important.  If you are at risk for a very bad allergy reaction (anaphylaxis), keep an auto-injector with you all the time. This is called an epinephrine injection. ? This is pre-measured medicine with a needle. You can put it into your skin by yourself. ? Right after you have a very bad allergy reaction, you or a person with you must give the medicine in less than a few minutes. This is an emergency.  If you have ever had a very bad allergy reaction, wear a medical alert bracelet or necklace. Your very bad allergy should be written on it. Contact a health care provider if:  Your symptoms do not get better with treatment. Get help right away if:  You have symptoms of a very bad allergy reaction. These include: ? A swollen mouth, tongue, or throat. ? Pain or tightness in your chest. ? Trouble breathing. ? Being short of breath. ? Dizziness. ? Fainting. ? Very bad pain  in your belly (abdomen). ? Throwing up (vomiting). ? Watery poop (diarrhea). Summary  An allergy means that your body reacts to something that bothers it (allergen). It is not a normal reaction.  Stay away from things that make your body react.  Take over-the-counter and prescription medicines only as told by your doctor.  If you are at risk for a very bad allergy reaction, carry an auto-injector (epinephrine injection) all the time. Also, wear a medical alert bracelet or necklace so people know about your allergy. This information is not intended to replace advice given to you by your health care provider. Make sure you discuss any questions you have with your health care provider. Document Revised: 05/02/2018 Document Reviewed: 04/26/2016 Elsevier Patient Education  [employer].   If you have lab work done today you will be contacted with your lab results within the next 2 weeks.  If you have not heard from [place] then please contact us. The fastest way to get your results is to register for My Chart.   IF you received an x-ray today, you will receive an invoice from [facility]. Please contact [facility] at [phone] with questions or concerns regarding your invoice.   IF you received labwork today, you will receive an invoice from [facility]. Please contact [facility] at [phone] with questions or concerns regarding your invoice.   Our billing staff will not be able to assist you with questions regarding bills from these companies.  You will be contacted with the lab results as soon as they are available. The fastest way to get your results is to activate your My Chart account. Instructions are located on the last page of this paperwork. If you have not heard from [place] regarding the results in 2 weeks, please contact this office.

## 2020-01-17 NOTE — Progress Notes (Signed)
Virtual Visit Note  I connected with patient on 01/17/20 at 1340 by telephone due to unable to work Epic video visit and verified that I am speaking with the correct person using two identifiers. Angela Stevenson is currently located at home and no family members are currently with them during visit. The provider, Azalee Course Jakyria Bleau, FNP is located in their office at time of visit.  I discussed the limitations, risks, security and privacy concerns of performing an evaluation and management service by telephone and the availability of in person appointments. I also discussed with the patient that there may be a patient responsible charge related to this service. The patient expressed understanding and agreed to proceed.   I provided 20 minutes of non-face-to-face time during this encounter.  Chief Complaint  Patient presents with  . Cough    Pt reports dry cough for 1 month COVID test at start was negative does have another scheduled. Pt reports coughs through morning okay through afternoon     HPI ? BF was sick months ago Covid test was negative as was hers a month ago Cough in the morning when she wakes up Worse when taking a deep breath Lasts until about noon Did have asthma as a child No longer takes any inhalers Recently got a new happy   No Known Allergies  Prior to Admission medications   Medication Sig Start Date End Date Taking? Authorizing Provider  norethindrone-ethinyl estradiol (LOESTRIN FE) 1-20 MG-MCG tablet Take 1 tablet by mouth daily. Patient not taking: Reported on 01/17/2020 07/31/19   [name], MD    Past Medical History:  Diagnosis Date  . Asthma   . RA (rheumatoid arthritis) (HCC)     History reviewed. No pertinent surgical history.  Social History   Tobacco Use  . Smoking status: Never Smoker  . Smokeless tobacco: Never Used  Substance Use Topics  . Alcohol use: No    Family History  Problem Relation Age of Onset  . Hypertension Father    . Cancer Maternal Grandmother        lung   . Cancer Paternal Grandmother   . Cancer Paternal Grandfather     Review of Systems  HENT: Positive for congestion. Negative for sinus pain and sore throat.   Eyes: Negative for discharge and redness.  Respiratory: Positive for cough (in the AM). Negative for sputum production, shortness of breath and wheezing.        Doesn't snore  Cardiovascular: Negative for chest pain.  Gastrointestinal: Positive for nausea. Negative for abdominal pain, heartburn and vomiting.       Denies bad taste in mouth, or a burning sensation    Objective  Constitutional:      General: She is not in acute distress.    Appearance: Normal appearance. She is not ill-appearing.   Pulmonary:     Effort: Pulmonary effort is normal. No respiratory distress.  Neurological:     Mental Status: She is alert and oriented to person, place, and time.  Psychiatric:        Mood and Affect: Mood normal.        Behavior: Behavior normal.     ASSESSMENT and PLAN  Problem List Items Addressed This Visit   None   Visit Diagnoses    Allergy, initial encounter    -  Primary   Relevant Medications   cetirizine (ZYRTEC) 10 MG tablet nightly   fluticasone (FLONASE) 50 MCG/ACT nasal spray daily   Uncomplicated asthma,  unspecified asthma severity, unspecified whether persistent       Relevant Medications   albuterol (VENTOLIN HFA) 108 (90 Base) MCG/ACT inhaler As needed for cough    Cough      Discussed step up treatment of treating allergies and using inhaler for cough Will follow up and RTC precautions provided R/se/b of medications discussed Discussed conservative management      Return in about 2 months (around 03/19/2020).   The above assessment and management plan was discussed with the patient. The patient verbalized understanding of and has agreed to the management plan. Patient is aware to call the clinic if symptoms persist or worsen. Patient is aware when  to return to the clinic for a follow-up visit. Patient educated on when it is appropriate to go to the emergency department.     Macario Carls Karessa Onorato, FNP-BC Primary [facility] at [facility] [address] [place], [place] [address] Ph.  [phone] Fax [phone]

## 2020-01-29 ENCOUNTER — Ambulatory Visit: Payer: Self-pay

## 2020-01-29 ENCOUNTER — Ambulatory Visit: Payer: Self-pay | Attending: Internal Medicine

## 2020-01-29 ENCOUNTER — Other Ambulatory Visit: Payer: Self-pay

## 2020-01-29 DIAGNOSIS — Z23 Encounter for immunization: Secondary | ICD-10-CM

## 2020-01-29 NOTE — Progress Notes (Signed)
   Covid-19 Vaccination Clinic  Name:  Angela Stevenson    MRN: [id number] DOB: 2000/06/20  01/29/2020  [name] was observed post Covid-19 immunization for 15 minutes without incident. She was provided with Vaccine Information Sheet and instruction to access the V-Safe system.   [name] was instructed to call 911 with any severe reactions post vaccine: Marland Kitchen Difficulty breathing  . Swelling of face and throat  . A fast heartbeat  . A bad rash all over body  . Dizziness and weakness   Immunizations Administered    Name Date Dose VIS Date Route   Pfizer COVID-19 Vaccine 01/29/2020  1:41 PM 0.3 mL 11/14/2019 Intramuscular   Manufacturer: [employer], [employer]   Lot: [id number]   NDC: [address]-7

## 2020-02-01 ENCOUNTER — Telehealth: Payer: Self-pay | Admitting: Family Medicine

## 2020-02-11 ENCOUNTER — Ambulatory Visit: Payer: PRIVATE HEALTH INSURANCE | Admitting: Obstetrics & Gynecology

## 2020-02-19 ENCOUNTER — Ambulatory Visit: Payer: Self-pay

## 2020-02-19 ENCOUNTER — Ambulatory Visit: Payer: Self-pay | Attending: Internal Medicine

## 2020-02-19 ENCOUNTER — Ambulatory Visit: Payer: Self-pay | Admitting: *Deleted

## 2020-02-19 DIAGNOSIS — Z23 Encounter for immunization: Secondary | ICD-10-CM

## 2020-02-19 NOTE — Progress Notes (Signed)
   Covid-19 Vaccination Clinic  Name:  Angela Stevenson    MRN: [id number] DOB: September 15, 2000  02/19/2020  Angela Stevenson was observed post Covid-19 immunization for 15 minutes without incident. She was provided with Vaccine Information Sheet and instruction to access the V-Safe system.   Angela Stevenson was instructed to call 911 with any severe reactions post vaccine: Marland Kitchen Difficulty breathing  . Swelling of face and throat  . A fast heartbeat  . A bad rash all over body  . Dizziness and weakness   Immunizations Administered    Name Date Dose VIS Date Route   PFIZER Comrnaty(Gray TOP) Covid-19 Vaccine 02/19/2020  1:50 PM 0.3 mL 01/03/2020 Intramuscular   Manufacturer: [employer], [employer]   Lot: [id number]   NDC: [phone]

## 2020-02-19 NOTE — Telephone Encounter (Signed)
Pt needs telemed for COVID like symptoms with 2 negative tests she is aware she needs to be seen in ED if worsens or she has trouble breathing.  Thank you!

## 2020-02-19 NOTE — Telephone Encounter (Signed)
2nd call to NT today. Call ended on 1 st call and patient returned call.  C/o covid symptoms but testing negative x 2. Patient unable to remember the dates of testing. Exposed to boyfriend who was positive for covid over 2 weeks ago. C/o continued runny nose, coughing, sometimes productive cough with yellow sputum. C/o  OTC mucinex tried and patient does not feel better. C/o SOB when laying down and reports she is less SOB with movement. Encouraged patient to drink plenty of fluids and deep breath and cough exercises. Encouraged to retest for covid. Patient reports she has had symptoms for weeks.  Instructed patient to call PCP today and see if clinic will set her up with a new provider. Her PCP has left the practice. Care advise given. Patient verbalized understanding of care advise and to call back or go to ED if symptoms worsen.   Reason for Disposition . HIGH RISK for severe COVID complications (e.g., age > 64 years, obesity with BMI > 25, pregnant, chronic lung disease or other chronic medical condition)  (Exception: Already seen by PCP and no new or worsening symptoms.)  Answer Assessment - Initial Assessment Questions 1. COVID-19 DIAGNOSIS: "Who made your COVID-19 diagnosis?" "Was it confirmed by a positive lab test?" If not diagnosed by a HCP, ask "Are there lots of cases (community spread) where you live?" Note: See public health department website, if unsure.     2 covid tests negative per patient report 2. COVID-19 EXPOSURE: "Was there any known exposure to COVID before the symptoms began?" CDC Definition of close contact: within 6 feet (2 meters) for a total of 15 minutes or more over a 24-hour period.      Boyfriend greater than 2 weeks ago  3. ONSET: "When did the COVID-19 symptoms start?"      Few weeks ago  4. WORST SYMPTOM: "What is your worst symptom?" (e.g., cough, fever, shortness of breath, muscle aches)     Cough SOB 5. COUGH: "Do you have a cough?" If Yes, ask: "How bad is the  cough?"       Yes productive yellow sputum 6. FEVER: "Do you have a fever?" If Yes, ask: "What is your temperature, how was it measured, and when did it start?"     no 7. RESPIRATORY STATUS: "Describe your breathing?" (e.g., shortness of breath, wheezing, unable to speak)      SOB 8. BETTER-SAME-WORSE: "Are you getting better, staying the same or getting worse compared to yesterday?"  If getting worse, ask, "In what way?"     Same and a little worse 9. HIGH RISK DISEASE: "Do you have any chronic medical problems?" (e.g., asthma, heart or lung disease, weak immune system, obesity, etc.)     Asthma  10. VACCINE: "Have you gotten the COVID-19 vaccine?" If Yes ask: "Which one, how many shots, when did you get it?"       Pfizer 01/29/20 1st vaccine only.  11. PREGNANCY: "Is there any chance you are pregnant?" "When was your last menstrual period?"       na 12. OTHER SYMPTOMS: "Do you have any other symptoms?"  (e.g., chills, fatigue, headache, loss of smell or taste, muscle pain, sore throat; new loss of smell or taste especially support the diagnosis of COVID-19)       Cough, runny nose,SOB, productive cough  Protocols used: CORONAVIRUS (COVID-19) DIAGNOSED OR SUSPECTED-A-AH

## 2020-02-19 NOTE — Telephone Encounter (Signed)
Saw sagardia 10/2018

## 2020-02-19 NOTE — Telephone Encounter (Signed)
Called pt and sch appt. Pt is not est with a provider here at PCP. Can see pt for acute visit.

## 2020-02-19 NOTE — Telephone Encounter (Signed)
Patient called stating that she has tested negative for COVID-19 2 weeks aago she has been told her symptoms are allergy related and had OV. She called because she is finding that she has cough and SOB at night.  She is producing mucus.  Patient ended call stating she had incoming call that she needed to take Triage was not complete.  She states she will call back

## 2020-02-20 ENCOUNTER — Telehealth (INDEPENDENT_AMBULATORY_CARE_PROVIDER_SITE_OTHER): Payer: PRIVATE HEALTH INSURANCE | Admitting: Family Medicine

## 2020-02-20 ENCOUNTER — Encounter: Payer: Self-pay | Admitting: Family Medicine

## 2020-02-20 ENCOUNTER — Other Ambulatory Visit: Payer: Self-pay

## 2020-02-20 DIAGNOSIS — J45909 Unspecified asthma, uncomplicated: Secondary | ICD-10-CM

## 2020-02-20 DIAGNOSIS — T7840XA Allergy, unspecified, initial encounter: Secondary | ICD-10-CM

## 2020-02-20 MED ORDER — FLUTICASONE PROPIONATE 50 MCG/ACT NA SUSP: 2.0000 | Freq: Every day | NASAL | 6 refills | Status: DC

## 2020-02-20 MED ORDER — ALBUTEROL SULFATE HFA 108 (90 BASE) MCG/ACT IN AERS: 2.0000 | INHALATION_SPRAY | Freq: Four times a day (QID) | RESPIRATORY_TRACT | 2 refills | Status: AC | PRN

## 2020-02-20 MED ORDER — CETIRIZINE HCL 10 MG PO TABS: 10.0000 mg | ORAL_TABLET | Freq: Every day | ORAL | 3 refills | Status: DC

## 2020-02-20 NOTE — Progress Notes (Signed)
Virtual Visit Note  I connected with patient on 02/20/20 at 1341 by telephone due to unable to work Epic video visit and verified that I am speaking with the correct person using two identifiers. Angela Stevenson is currently located at home and no family members are currently with them during visit. The provider, Azalee Course Bronsen Serano, FNP is located in their office at time of visit.  I discussed the limitations, risks, security and privacy concerns of performing an evaluation and management service by telephone and the availability of in person appointments. I also discussed with the patient that there may be a patient responsible charge related to this service. The patient expressed understanding and agreed to proceed.   I provided 20 minutes of non-face-to-face time during this encounter.  Chief Complaint  Patient presents with  . Cough    Both dry and productive worse is S/o/b at night causing insomnia , going on 3 to 4 weeks, took all 3 covid tests negative but living with someone who had covid /now negative - she would like if a chest XR is needed , taking mucinex    HPI ? Allergy/Asthma Had prescribed: Cetrizine nightly, Daily flonase, Albuterol as needed  Currently Taking Mucinex and Benadryl Didn't have the money for the other medication Never picked up the medication    Cough is worse at night when lays down Wheezing continues  Allergies  Allergen Reactions  . Mushroom Extract Complex Swelling    Numbness And redness     Prior to Admission medications   Medication Sig Start Date End Date Taking? Authorizing Provider  albuterol (VENTOLIN HFA) 108 (90 Base) MCG/ACT inhaler Inhale 2 puffs into the lungs every 6 (six) hours as needed for wheezing or shortness of breath. 01/17/20   Domanique Huesman, Azalee Course, FNP  cetirizine (ZYRTEC) 10 MG tablet Take 1 tablet (10 mg total) by mouth at bedtime. 01/17/20   Logen Heintzelman, Azalee Course, FNP  fluticasone (FLONASE) 50 MCG/ACT nasal spray Place 2 sprays into  both nostrils daily. 01/17/20   Lauran Romanski, Azalee Course, FNP  norethindrone-ethinyl estradiol (LOESTRIN FE) 1-20 MG-MCG tablet Take 1 tablet by mouth daily. Patient not taking: Reported on 01/17/2020 07/31/19   [name], MD    Past Medical History:  Diagnosis Date  . Asthma   . RA (rheumatoid arthritis) (HCC)     History reviewed. No pertinent surgical history.  Social History   Tobacco Use  . Smoking status: Never Smoker  . Smokeless tobacco: Never Used  Substance Use Topics  . Alcohol use: No    Family History  Problem Relation Age of Onset  . Hypertension Father   . Cancer Maternal Grandmother        lung   . Cancer Paternal Grandmother   . Cancer Paternal Grandfather     Review of Systems  Constitutional: Negative for chills, fever, malaise/fatigue and weight loss.  HENT: Negative for congestion, ear discharge, ear pain, sinus pain and sore throat.   Respiratory: Positive for cough and wheezing. Negative for sputum production and shortness of breath.   Cardiovascular: Negative for chest pain and palpitations.  Gastrointestinal: Negative for abdominal pain, constipation, diarrhea, heartburn, nausea and vomiting.  Musculoskeletal: Negative for myalgias.  Neurological: Negative for focal weakness, weakness and headaches.    Objective  Constitutional:      General: Not in acute distress.    Appearance: Normal appearance. Not ill-appearing.   Pulmonary:     Effort: Pulmonary effort is normal. No respiratory distress.  Neurological:  Mental Status: Alert and oriented to person, place, and time.  Psychiatric:        Mood and Affect: Mood normal.        Behavior: Behavior normal.     ASSESSMENT and PLAN  Problem List Items Addressed This Visit   None   Visit Diagnoses    Uncomplicated asthma, unspecified asthma severity, unspecified whether persistent       Relevant Medications   albuterol (VENTOLIN HFA) 108 (90 Base) MCG/ACT inhaler   Allergy, initial  encounter       Relevant Medications   cetirizine (ZYRTEC) 10 MG tablet   fluticasone (FLONASE) 50 MCG/ACT nasal spray      Plan . Take cetrizine nightly, flonase in the AM . Use albuterol for wheezing as needed  Return if symptoms worsen or fail to improve.   The above assessment and management plan was discussed with the patient. The patient verbalized understanding of and has agreed to the management plan. Patient is aware to call the clinic if symptoms persist or worsen. Patient is aware when to return to the clinic for a follow-up visit. Patient educated on when it is appropriate to go to the emergency department.     Macario Carls Staysha Truby, FNP-BC Primary Care at [facility] [address] [place], [place] [address] Ph.  [phone] Fax [phone]

## 2020-02-20 NOTE — Patient Instructions (Addendum)
Asthma, Adult  Asthma is a long-term (chronic) condition in which the airways get tight and narrow. The airways are the breathing passages that lead from the nose and mouth down into the lungs. A person with asthma will have times when symptoms get worse. These are called asthma attacks. They can cause coughing, whistling sounds when you breathe (wheezing), shortness of breath, and chest pain. They can make it hard to breathe. There is no cure for asthma, but medicines and lifestyle changes can help control it. There are many things that can bring on an asthma attack or make asthma symptoms worse (triggers). Common triggers include:  Mold.  Dust.  Cigarette smoke.  Cockroaches.  Things that can cause allergy symptoms (allergens). These include animal skin flakes (dander) and pollen from trees or grass.  Things that pollute the air. These may include household cleaners, wood smoke, smog, or chemical odors.  Cold air, weather changes, and wind.  Crying or laughing hard.  Stress.  Certain medicines or drugs.  Certain foods such as dried fruit, potato chips, and grape juice.  Infections, such as a cold or the flu.  Certain medical conditions or diseases.  Exercise or tiring activities. Asthma may be treated with medicines and by staying away from the things that cause asthma attacks. Types of medicines may include:  Controller medicines. These help prevent asthma symptoms. They are usually taken every day.  Fast-acting reliever or rescue medicines. These quickly relieve asthma symptoms. They are used as needed and provide short-term relief.  Allergy medicines if your attacks are brought on by allergens.  Medicines to help control the body's defense (immune) system. Follow these instructions at home: Avoiding triggers in your home  Change your heating and air conditioning filter often.  Limit your use of fireplaces and wood stoves.  Get rid of pests (such as roaches and  mice) and their droppings.  Throw away plants if you see mold on them.  Clean your floors. Dust regularly. Use cleaning products that do not smell.  Have someone vacuum when you are not home. Use a vacuum cleaner with a HEPA filter if possible.  Replace carpet with wood, tile, or vinyl flooring. Carpet can trap animal skin flakes and dust.  Use allergy-proof pillows, mattress covers, and box spring covers.  Wash bed sheets and blankets every week in hot water. Dry them in a dryer.  Keep your bedroom free of any triggers.  Avoid pets and keep windows closed when things that cause allergy symptoms are in the air.  Use blankets that are made of polyester or cotton.  Clean bathrooms and kitchens with bleach. If possible, have someone repaint the walls in these rooms with mold-resistant paint. Keep out of the rooms that are being cleaned and painted.  Wash your hands often with soap and water. If soap and water are not available, use hand sanitizer.  Do not allow anyone to smoke in your home. General instructions  Take over-the-counter and prescription medicines only as told by your doctor. ? Talk with your doctor if you have questions about how or when to take your medicines. ? Make note if you need to use your medicines more often than usual.  Do not use any products that contain nicotine or tobacco, such as cigarettes and e-cigarettes. If you need help quitting, ask your doctor.  Stay away from secondhand smoke.  Avoid doing things outdoors when allergen counts are high and when air quality is low.  Wear a ski  mask when doing outdoor activities in the winter. The mask should cover your nose and mouth. Exercise indoors on cold days if you can.  Warm up before you exercise. Take time to cool down after exercise.  Use a peak flow meter as told by your doctor. A peak flow meter is a tool that measures how well the lungs are working.  Keep track of the peak flow meter's readings.  Write them down.  Follow your asthma action plan. This is a written plan for taking care of your asthma and treating your attacks.  Make sure you get all the shots (vaccines) that your doctor recommends. Ask your doctor about a flu shot and a pneumonia shot.  Keep all follow-up visits as told by your doctor. This is important. Contact a doctor if:  You have wheezing, shortness of breath, or a cough even while taking medicine to prevent attacks.  The mucus you cough up (sputum) is thicker than usual.  The mucus you cough up changes from clear or white to yellow, green, gray, or bloody.  You have problems from the medicine you are taking, such as: ? A rash. ? Itching. ? Swelling. ? Trouble breathing.  You need reliever medicines more than 2-3 times a week.  Your peak flow reading is still at 50-79% of your personal best after following the action plan for 1 hour.  You have a fever. Get help right away if:  You seem to be worse and are not responding to medicine during an asthma attack.  You are short of breath even at rest.  You get short of breath when doing very little activity.  You have trouble eating, drinking, or talking.  You have chest pain or tightness.  You have a fast heartbeat.  Your lips or fingernails start to turn blue.  You are light-headed or dizzy, or you faint.  Your peak flow is less than 50% of your personal best.  You feel too tired to breathe normally. Summary  Asthma is a long-term (chronic) condition in which the airways get tight and narrow. An asthma attack can make it hard to breathe.  Asthma cannot be cured, but medicines and lifestyle changes can help control it.  Make sure you understand how to avoid triggers and how and when to use your medicines. This information is not intended to replace advice given to you by your health care provider. Make sure you discuss any questions you have with your health care provider. Document Revised:  05/16/2019 Document Reviewed: 05/16/2019 Elsevier Patient Education  2021 Elsevier Inc.  Allergies, Adult An allergy means that your body reacts to something that bothers it (allergen). This can happen from something that you eat, breathe in, or touch. Allergies often affect the nose, eyes, skin, and stomach. They can be mild, moderate, or very bad (severe). An allergy cannot spread from person to person. They can happen at any age. Sometimes, people outgrow them. What are the causes?  Outdoor things, such as pollen, car fumes, and mold.  Indoor things, such as dust, smoke, mold, and pets.  Foods.  Medicines.  Things that bother your skin, such as perfume and bug bites. What increases the risk?  Having family members with allergies or asthma. What are the signs or symptoms? Symptoms depend on how bad your allergy is. Mild to moderate symptoms  Runny nose, stuffy nose, or sneezing.  Itchy mouth, ears, or throat.  A feeling of mucus dripping down the back of your throat.  Sore throat.  Eyes that are itchy, red, watery, or puffy.  A skin rash, or red, swollen areas of skin (hives).  Stomach cramps or bloating. Severe symptoms Very bad allergies to food, medicine, or bug bites may cause a very bad allergy reaction (anaphylaxis). This can be life-threatening. Symptoms include:  A red face.  Wheezing or coughing.  Swollen lips, tongue, or mouth.  Tight or swollen throat.  Chest pain or tightness, or a fast heartbeat.  Trouble breathing or shortness of breath.  Pain in your belly (abdomen), vomiting, or watery poop (diarrhea).  Feeling dizzy or fainting. How is this treated? Treatment for this condition depends on your symptoms. Treatment may include:  Cold, wet cloths for itching and swelling.  Eye drops, nose sprays, or skin creams.  Washing out your nose each day.  A humidifier.  Medicines.  A change to the foods you eat.  Being exposed again and  again to tiny amounts of allergens. This helps your body get used to them. You might have: ? Allergy shots. ? Very small amounts of allergen put under your tongue.  An emergency shot (auto-injector pen) if you have a very bad allergy reaction. ? This is a medicine with a needle. You can put it into your skin by yourself. ? Your doctor will teach you how to use it.      Follow these instructions at home: Medicines  Take or apply over-the-counter and prescription medicines only as told by your doctor.  If you are at risk for a very bad allergy reaction, keep an auto-injector pen with you all the time.   Eating and drinking  Follow instructions from your doctor about what to eat and drink.  Drink enough fluid to keep your pee (urine) pale yellow. General instructions  If you have ever had a very bad allergy reaction, wear a medical alert bracelet or necklace.  Stay away from things that you are allergic to.  Keep all follow-up visits as told by your doctor. This is important. Contact a doctor if:  Your symptoms do not get better with treatment. Get help right away if:  You have symptoms of a very bad allergy reaction. These include: ? A swollen mouth, tongue, or throat. ? Pain or tightness in your chest. ? Trouble breathing. ? Being short of breath. ? Dizziness. ? Fainting. ? Very bad pain in your belly. ? Vomiting. ? Watery poop. These symptoms may be an emergency. Do not wait to see if the symptoms will go away. Get medical help right away. Call your local emergency services (911 in the [place] Do not drive yourself to the hospital. Summary  Take or apply over-the-counter and prescription medicines only as told by your doctor.  Stay away from things you are allergic to.  If you are at risk for a very bad allergy reaction, carry an auto-injector pen all the time.  Wear a medical alert bracelet or necklace.  Very bad allergy reactions can be life-threatening. Get help  right away. This information is not intended to replace advice given to you by your health care provider. Make sure you discuss any questions you have with your health care provider. Document Revised: 11/22/2018 Document Reviewed: 11/22/2018 Elsevier Patient Education  2021 [employer].    If you have lab work done today you will be contacted with your lab results within the next 2 weeks.  If you have not heard from [place] then please contact us. The fastest way to get  your results is to register for My Chart.   IF you received an x-ray today, you will receive an invoice from [facility]. Please contact [facility] at [phone] with questions or concerns regarding your invoice.   IF you received labwork today, you will receive an invoice from [facility]. Please contact [facility] at [phone] with questions or concerns regarding your invoice.   Our billing staff will not be able to assist you with questions regarding bills from these companies.  You will be contacted with the lab results as soon as they are available. The fastest way to get your results is to activate your My Chart account. Instructions are located on the last page of this paperwork. If you have not heard from [place] regarding the results in 2 weeks, please contact this office.

## 2020-02-22 ENCOUNTER — Encounter: Payer: Self-pay | Admitting: Obstetrics & Gynecology

## 2020-02-22 ENCOUNTER — Other Ambulatory Visit: Payer: Self-pay

## 2020-02-22 ENCOUNTER — Ambulatory Visit (INDEPENDENT_AMBULATORY_CARE_PROVIDER_SITE_OTHER): Payer: PRIVATE HEALTH INSURANCE | Admitting: Obstetrics & Gynecology

## 2020-02-22 VITALS — BP 110/82 | HR 92 | Resp 18

## 2020-02-22 DIAGNOSIS — R87610 Atypical squamous cells of undetermined significance on cytologic smear of cervix (ASC-US): Secondary | ICD-10-CM

## 2020-02-22 DIAGNOSIS — N870 Mild cervical dysplasia: Secondary | ICD-10-CM | POA: Diagnosis not present

## 2020-02-22 DIAGNOSIS — Z113 Encounter for screening for infections with a predominantly sexual mode of transmission: Secondary | ICD-10-CM | POA: Diagnosis not present

## 2020-02-22 DIAGNOSIS — R87810 Cervical high risk human papillomavirus (HPV) DNA test positive: Secondary | ICD-10-CM

## 2020-02-22 NOTE — Progress Notes (Signed)
    Angela Stevenson January 11, 2001 [id number]        20 y.o.  G0 Stable boyfriend  RP: ASCUS/HPV HR/CIN 1 for Repeat Pap  HPI: ASCUS/HPV HR pos 03/2019.  Colpo 07/2019 CIN 1.  HPV 16-18-45 Neg.  Well on BCPs.  No pelvic pain.  Normal vaginal secretions.  No fever.   OB History  Gravida Para Term Preterm AB Living  0 0 0 0 0 0  SAB IAB Ectopic Multiple Live Births  0 0 0 0 0    Past medical history,surgical history, problem list, medications, allergies, family history and social history were all reviewed and documented in the EPIC chart.   Directed ROS with pertinent positives and negatives documented in the history of present illness/assessment and plan.  Exam:  Vitals:   02/22/20 1440  BP: 110/82  Pulse: 92  Resp: 18   General appearance:  Normal  Abdomen: Normal  Gynecologic exam: Vulva normal.  Speculum: Cervix and vagina normal.  Pap test with high-risk HPV and gonorrhea and Chlamydia done.   Assessment/Plan:  20 y.o. G0P0000   1. ASCUS with positive high risk HPV cervical ASCUs/HPV HR pos 03/2019.  Colpo 07/2019 CIN 1.  HPV 16-18-45 Neg.  Repeat Pap/HPV HR done today.  Management per results.  Follow-up annual gynecologic exam May/June 2022.  2. Dysplasia of cervix, low grade (CIN 1) Pap/HPV HR done.  3. Screen for STD (sexually transmitted disease) Gono-Chlam done.  [name] MD, 2:59 PM 02/22/2020

## 2020-02-22 NOTE — Addendum Note (Signed)
Addended by: Leda Min on: 02/22/2020 03:07 PM   Modules accepted: Orders

## 2020-02-25 LAB — PAP IG, CT-NG NAA, HPV HIGH-RISK
C. trachomatis RNA, TMA: NOT DETECTED
HPV DNA High Risk: NOT DETECTED
N. gonorrhoeae RNA, TMA: NOT DETECTED

## 2020-03-29 ENCOUNTER — Emergency Department (HOSPITAL_COMMUNITY): Payer: BC Managed Care – PPO

## 2020-03-29 ENCOUNTER — Emergency Department (HOSPITAL_COMMUNITY)
Admission: EM | Admit: 2020-03-29 | Discharge: 2020-03-29 | Disposition: A | Discharge status: Home / Self Care | Payer: BC Managed Care – PPO | Attending: Emergency Medicine | Admitting: Emergency Medicine

## 2020-03-29 ENCOUNTER — Encounter (HOSPITAL_COMMUNITY): Payer: Self-pay | Admitting: *Deleted

## 2020-03-29 ENCOUNTER — Ambulatory Visit (HOSPITAL_COMMUNITY)
Admission: EM | Admit: 2020-03-29 | Discharge: 2020-03-29 | Disposition: A | Discharge status: Home / Self Care | Payer: Self-pay | Attending: Medical Oncology | Admitting: Medical Oncology

## 2020-03-29 ENCOUNTER — Encounter (HOSPITAL_COMMUNITY): Payer: Self-pay | Admitting: Emergency Medicine

## 2020-03-29 ENCOUNTER — Other Ambulatory Visit: Payer: Self-pay

## 2020-03-29 DIAGNOSIS — R509 Fever, unspecified: Secondary | ICD-10-CM

## 2020-03-29 DIAGNOSIS — K047 Periapical abscess without sinus: Secondary | ICD-10-CM | POA: Diagnosis not present

## 2020-03-29 DIAGNOSIS — L03211 Cellulitis of face: Secondary | ICD-10-CM | POA: Insufficient documentation

## 2020-03-29 DIAGNOSIS — K0889 Other specified disorders of teeth and supporting structures: Secondary | ICD-10-CM | POA: Diagnosis present

## 2020-03-29 DIAGNOSIS — J45909 Unspecified asthma, uncomplicated: Secondary | ICD-10-CM | POA: Insufficient documentation

## 2020-03-29 DIAGNOSIS — R220 Localized swelling, mass and lump, head: Secondary | ICD-10-CM

## 2020-03-29 DIAGNOSIS — R112 Nausea with vomiting, unspecified: Secondary | ICD-10-CM

## 2020-03-29 DIAGNOSIS — R110 Nausea: Secondary | ICD-10-CM | POA: Insufficient documentation

## 2020-03-29 LAB — CBC WITH DIFFERENTIAL/PLATELET
Abs Immature Granulocytes: 0.04 10*3/uL (ref 0.00–0.07)
Basophils Absolute: 0 10*3/uL (ref 0.0–0.1)
Basophils Relative: 0 %
Eosinophils Absolute: 0.2 10*3/uL (ref 0.0–0.5)
Eosinophils Relative: 1 %
HCT: 40.1 % (ref 36.0–46.0)
Hemoglobin: 13.5 g/dL (ref 12.0–15.0)
Immature Granulocytes: 0 %
Lymphocytes Relative: 18 %
Lymphs Abs: 2.4 10*3/uL (ref 0.7–4.0)
MCH: 29.2 pg (ref 26.0–34.0)
MCHC: 33.7 g/dL (ref 30.0–36.0)
MCV: 86.8 fL (ref 80.0–100.0)
Monocytes Absolute: 0.8 10*3/uL (ref 0.1–1.0)
Monocytes Relative: 6 %
Neutro Abs: 10 10*3/uL — ABNORMAL HIGH (ref 1.7–7.7)
Neutrophils Relative %: 75 %
Platelets: 322 10*3/uL (ref 150–400)
RBC: 4.62 MIL/uL (ref 3.87–5.11)
RDW: 12.6 % (ref 11.5–15.5)
WBC: 13.5 10*3/uL — ABNORMAL HIGH (ref 4.0–10.5)
nRBC: 0 % (ref 0.0–0.2)

## 2020-03-29 LAB — BASIC METABOLIC PANEL
Anion gap: 9 (ref 5–15)
BUN: 11 mg/dL (ref 6–20)
CO2: 24 mmol/L (ref 22–32)
Calcium: 9.2 mg/dL (ref 8.9–10.3)
Chloride: 104 mmol/L (ref 98–111)
Creatinine, Ser: 0.84 mg/dL (ref 0.44–1.00)
GFR, Estimated: >60 mL/min (ref >60)
Glucose, Bld: 77 mg/dL (ref 70–99)
Potassium: 3.7 mmol/L (ref 3.5–5.1)
Sodium: 137 mmol/L (ref 135–145)

## 2020-03-29 LAB — I-STAT BETA HCG BLOOD, ED (MC, WL, AP ONLY): I-stat hCG, quantitative: <5 m[IU]/mL (ref <5)

## 2020-03-29 MED ORDER — DEXAMETHASONE SODIUM PHOSPHATE 10 MG/ML IJ SOLN
10.0000 mg | Freq: Once | INTRAMUSCULAR | Status: AC
  Administered 2020-03-29: 10 mg via INTRAVENOUS
  Filled 2020-03-29: qty 1

## 2020-03-29 MED ORDER — MORPHINE SULFATE (PF) 4 MG/ML IV SOLN
4.0000 mg | Freq: Once | INTRAVENOUS | Status: AC
  Administered 2020-03-29: 4 mg via INTRAVENOUS
  Filled 2020-03-29: qty 1

## 2020-03-29 MED ORDER — KETOROLAC TROMETHAMINE 30 MG/ML IJ SOLN
30.0000 mg | Freq: Once | INTRAMUSCULAR | Status: AC
  Administered 2020-03-29: 30 mg via INTRAMUSCULAR

## 2020-03-29 MED ORDER — CLINDAMYCIN HCL 150 MG PO CAPS: 300.0000 mg | ORAL_CAPSULE | Freq: Three times a day (TID) | ORAL | 0 refills | Status: AC

## 2020-03-29 MED ORDER — CLINDAMYCIN PHOSPHATE 300 MG/50ML IV SOLN
300.0000 mg | Freq: Once | INTRAVENOUS | Status: DC
  Filled 2020-03-29: qty 50

## 2020-03-29 MED ORDER — HYDROCODONE-ACETAMINOPHEN 5-325 MG PO TABS: 1.0000 | ORAL_TABLET | Freq: Four times a day (QID) | ORAL | 0 refills | Status: DC | PRN

## 2020-03-29 MED ORDER — IOHEXOL 300 MG/ML  SOLN
75.0000 mL | Freq: Once | INTRAMUSCULAR | Status: AC | PRN
  Administered 2020-03-29: 75 mL via INTRAVENOUS

## 2020-03-29 MED ORDER — KETOROLAC TROMETHAMINE 30 MG/ML IJ SOLN
INTRAMUSCULAR | Status: AC
  Filled 2020-03-29: qty 1

## 2020-03-29 MED ORDER — SODIUM CHLORIDE 0.9 % IV BOLUS
1000.0000 mL | Freq: Once | INTRAVENOUS | Status: AC
  Administered 2020-03-29: 1000 mL via INTRAVENOUS

## 2020-03-29 NOTE — ED Provider Notes (Signed)
[facility] EMERGENCY DEPARTMENT Provider Note   CSN: [id number] Arrival date & time: 03/29/20  1318     History Chief Complaint  Patient presents with  . Dental Pain    Angela Stevenson is a 20 y.o. female presenting for evaluation of dental pain.  Patient states she initially had issues with her left lower tooth about a month ago, was told she needed root canal but was unable to do so at the time.  Over the past week, she reports gradually worsening pain at this left lower tooth.  Over the past 24 hours, she has had significant swelling and worsening pain of this area.  Due to the swelling, she is unable to open her mouth.  She is also having difficulty swallowing due to pain and swelling.  She reports pain extends into her neck and under her chin.  She had nausea and vomiting yesterday, no vomiting today.  She has not taken anything for her symptoms.  She denies fevers, chills, difficulty breathing, cough.  She has no medical problems, takes no medications daily.  Additional history attained per chart review.  Patient was evaluated at urgent care and sent to the ER.  Given Toradol there, which she reports significantly improved her pain.  HPI     Past Medical History:  Diagnosis Date  . Asthma   . Asthma 2013   Patient reported  . RA (rheumatoid arthritis) [facility])     Patient Active Problem List   Diagnosis Date Noted  . History of rheumatoid arthritis 10/31/2018  . Dizziness and giddiness 05/09/2018    History reviewed. No pertinent surgical history.   OB History    Gravida  0   Para  0   Term  0   Preterm  0   AB  0   Living  0     SAB  0   IAB  0   Ectopic  0   Multiple  0   Live Births  0           Family History  Problem Relation Age of Onset  . Hypertension Father   . Cancer Maternal Grandmother        lung   . Cancer Paternal Grandmother   . Cancer Paternal Grandfather     Social History   Tobacco Use  . Smoking  status: Never Smoker  . Smokeless tobacco: Never Used  Vaping Use  . Vaping Use: Never used  Substance Use Topics  . Alcohol use: No  . Drug use: No    Home Medications Prior to Admission medications   Medication Sig Start Date End Date Taking? Authorizing Provider  clindamycin (CLEOCIN) 150 MG capsule Take 2 capsules (300 mg total) by mouth 3 (three) times daily for 7 days. 03/29/20 04/05/20 Yes Trigger Frasier, PA-C  HYDROcodone-acetaminophen (NORCO/VICODIN) 5-325 MG tablet Take 1 tablet by mouth every 6 (six) hours as needed for severe pain. 03/29/20  Yes Orville Mena, PA-C  albuterol (VENTOLIN HFA) 108 (90 Base) MCG/ACT inhaler Inhale 2 puffs into the lungs every 6 (six) hours as needed for wheezing or shortness of breath. 02/20/20   Just, Azalee Course, FNP  cetirizine (ZYRTEC) 10 MG tablet Take 1 tablet (10 mg total) by mouth at bedtime. 02/20/20   Just, Azalee Course, FNP  fluticasone (FLONASE) 50 MCG/ACT nasal spray Place 2 sprays into both nostrils daily. 02/20/20   Just, Azalee Course, FNP  norethindrone-ethinyl estradiol (LOESTRIN FE) 1-20 MG-MCG tablet Take 1 tablet  by mouth daily. Patient not taking: No sig reported 07/31/19   [name], MD    Allergies    Mushroom extract complex  Review of Systems   Review of Systems  HENT: Positive for dental problem, facial swelling and trouble swallowing.   Gastrointestinal: Positive for nausea and vomiting.  All other systems reviewed and are negative.   Physical Exam Updated Vital Signs BP 124/73   Pulse 73   Temp 98.6 F (37 C)   Resp 16   LMP 03/15/2020 Comment: NEG HCG IN ED TODAY  SpO2 100%   Physical Exam Vitals and nursing note reviewed.  Constitutional:      General: She is not in acute distress.    Appearance: She is well-developed and well-nourished.     Comments: Appears nontoxic  HENT:     Head: Normocephalic and atraumatic.      Comments: Swelling and induration of the left lower face extending under the jaw  line.  Mild trismus with tenderness under the tongue. Tenderness palpation of left lower tooth with surrounding gum edema.     Mouth/Throat:   Eyes:     Extraocular Movements: Extraocular movements intact and EOM normal.     Conjunctiva/sclera: Conjunctivae normal.     Pupils: Pupils are equal, round, and reactive to light.  Cardiovascular:     Rate and Rhythm: Normal rate and regular rhythm.     Pulses: Normal pulses and intact distal pulses.  Pulmonary:     Effort: Pulmonary effort is normal. No respiratory distress.     Breath sounds: Normal breath sounds. No wheezing.  Abdominal:     General: There is no distension.     Palpations: Abdomen is soft. There is no mass.     Tenderness: There is no abdominal tenderness. There is no guarding or rebound.  Musculoskeletal:        General: Normal range of motion.     Cervical back: Normal range of motion and neck supple.  Skin:    General: Skin is warm and dry.     Capillary Refill: Capillary refill takes less than 2 seconds.  Neurological:     Mental Status: She is alert and oriented to person, place, and time.  Psychiatric:        Mood and Affect: Mood and affect normal.     ED Results / Procedures / Treatments   Labs (all labs ordered are listed, but only abnormal results are displayed) Labs Reviewed  CBC WITH DIFFERENTIAL/PLATELET - Abnormal; Notable for the following components:      Result Value   WBC 13.5 (*)    Neutro Abs 10.0 (*)    All other components within normal limits  BASIC METABOLIC PANEL  I-STAT BETA HCG BLOOD, ED ([facility], [facility], [facility] ONLY)    EKG None  Radiology CT Soft Tissue Neck W Contrast  Result Date: 03/29/2020 CLINICAL DATA:  Initial evaluation for acute dental infection, lymphadenopathy. EXAM: CT NECK WITH CONTRAST TECHNIQUE: Multidetector CT imaging of the neck was performed using the standard protocol following the bolus administration of intravenous contrast. CONTRAST:  30mL OMNIPAQUE IOHEXOL 300  MG/ML  SOLN COMPARISON:  None available. FINDINGS: Pharynx and larynx: Oral cavity grossly within normal limits, although evaluation somewhat limited by streak artifact from dental amalgam. There is asymmetric soft tissue swelling with inflammatory stranding adjacent to the left mandibular body, concerning for acute infection/cellulitis (series 4, image 52). An odontogenic source is suspected given location, although no appreciable dental carie or  periapical lucency is seen. No discrete abscess or drainable fluid collection. Palatine tonsils symmetric and normal. Parapharyngeal fat maintained. Remainder of the nasopharynx and oropharynx within normal limits. No retropharyngeal collection or swelling. Hypopharynx and supraglottic larynx within normal limits. Epiglottis normal. Glottis within normal limits. Subglottic airway patent clear. Salivary glands: Salivary glands including the parotid and submandibular glands are within normal limits. Thyroid: Normal. Lymph nodes: Slight asymmetric prominence of left level IB and II subcentimeter lymph nodes, suspected to be reactive. No other pathologic or enlarged lymph nodes within the neck. Vascular: Normal intravascular enhancement seen throughout the neck. Limited intracranial: Unremarkable. Visualized orbits: Unremarkable. Skeleton: No acute osseous finding. No discrete or worrisome osseous lesions. Upper chest: Visualized upper chest demonstrates no acute finding. Other: None. IMPRESSION: 1. Asymmetric soft tissue swelling with inflammatory stranding adjacent to the left mandibular body, concerning for acute infection/cellulitis. An odontogenic source is suspected given location, although no appreciable dental carie or periapical lucency is seen. No discrete abscess or drainable fluid collection. 2. Mild asymmetric prominence of left-sided cervical lymph nodes, suspected to be reactive. Electronically Signed   By: Rise Mu M.D.   On: 03/29/2020 20:38     Procedures Procedures   Medications Ordered in ED Medications  clindamycin (CLEOCIN) IVPB 300 mg (has no administration in time range)  dexamethasone (DECADRON) injection 10 mg (10 mg Intravenous Given 03/29/20 1751)  sodium chloride 0.9 % bolus 1,000 mL (0 mLs Intravenous Stopped 03/29/20 1906)  iohexol (OMNIPAQUE) 300 MG/ML solution 75 mL (75 mLs Intravenous Contrast Given 03/29/20 1951)  morphine 4 MG/ML injection 4 mg (4 mg Intravenous Given 03/29/20 2118)    ED Course  I have reviewed the triage vital signs and the nursing notes.  Pertinent labs & imaging results that were available during my care of the patient were reviewed by me and considered in my medical decision making (see chart for details).    MDM Rules/Calculators/A&P                          Patient presented for evaluation of dental pain and facial swelling.  On exam, patient appears nontoxic.  However exam is concerning as patient does have some trismus and pain under the tongue.  Consider Ludwick's.  Will obtain labs and CT.  Also consider trismus due to swelling and periodontal abscess.  Symptoms seem to have improved significantly with Toradol.  Will give Decadron to assist with swelling, clindamycin as antibiotics, continue to monitor.  Labs show leukocytosis, unsurprising in the setting of signs of a dental infection.  Otherwise labs are reassuring.  CT pending.  CT consistent with cellulitis/inflammation, likely dental in origin, however not consistent with Ludwick's.  On reassessment, patient symptoms continuing to improve with Decadron.  Trismus is improved.  Continues to handle airway well.  Discussed likely dental infection causing surrounding inflammation.  Discussed treatment with antibiotics, NSAIDs, and close follow-up with dentistry.  At this time, patient appears safe for discharge.  Return precautions given.  Patient states she understands agrees to plan.  Final Clinical Impression(s) / ED Diagnoses Final  diagnoses:  Dental infection  Facial cellulitis    Rx / DC Orders ED Discharge Orders         Ordered    clindamycin (CLEOCIN) 150 MG capsule  3 times daily        03/29/20 2055    HYDROcodone-acetaminophen (NORCO/VICODIN) 5-325 MG tablet  Every 6 hours PRN  03/29/20 2055           Alveria Apley, PA-C 03/29/20 2202    Charlynne Pander, MD 03/30/20 1501

## 2020-03-29 NOTE — ED Triage Notes (Signed)
Pt states she was sent from [facility] for dental infection.  C/o L lower toothache x 1 month.  Now having nausea and vomiting.  States pain is some better after shot at [facility].

## 2020-03-29 NOTE — Discharge Instructions (Addendum)
Take antibiotics as prescribed.  Take the entire course, even if symptoms improve. Take ibuprofen 3 times a day with meals.  Do not take other anti-inflammatories at the same time (Advil, Motrin, naproxen, Aleve). You may supplement with Tylenol if you need further pain control. Use ice packs for pain and swelling.  Use Norco as needed for severe breakthrough pain.  Have caution, this make you tired or groggy.  Do not drive or operate heavy machinery while taking this medicine. Call your dentist to let them know that you were started on antibiotics and see if they still to see you or reschedule your appointment. Return to the emergency room if you develop persistent high fevers, severe worsening pain, difficulty swallowing, difficulty breathing, or any new, worsening, or concerning symptoms

## 2020-03-29 NOTE — ED Triage Notes (Signed)
Pt had had dental pain for 2 days due to Lt lower dental problem . Pt also reports pain with swallowing.

## 2020-03-29 NOTE — Discharge Instructions (Addendum)
Please go directly to the Emergency Room.

## 2020-03-29 NOTE — ED Provider Notes (Signed)
[facility]    CSN: [id number] Arrival date & time: 03/29/20  1054      History   Chief Complaint Chief Complaint  Patient presents with  . Dental Pain    HPI Angela Stevenson is a 20 y.o. female. Pt presents with her mother.   HPI   Dental Pain: Pt is unable to give history due to crying. Mother states that 3 days ago she developed lower left sided dental pain. Pain has significantly worsened. She is now unable to eat, drink, swallow and has had some scant voice changes. Has had vomiting and nausea. No trouble breathing and no known fever but has a low grade fever in office today. They have not tried anything for symptoms. [facility]: 2 weeks ago.    Past Medical History:  Diagnosis Date  . Asthma   . Asthma 2013   Patient reported  . RA (rheumatoid arthritis) [facility])     Patient Active Problem List   Diagnosis Date Noted  . History of rheumatoid arthritis 10/31/2018  . Dizziness and giddiness 05/09/2018    History reviewed. No pertinent surgical history.  OB History    Gravida  0   Para  0   Term  0   Preterm  0   AB  0   Living  0     SAB  0   IAB  0   Ectopic  0   Multiple  0   Live Births  0            Home Medications    Prior to Admission medications   Medication Sig Start Date End Date Taking? Authorizing Provider  cetirizine (ZYRTEC) 10 MG tablet Take 1 tablet (10 mg total) by mouth at bedtime. 02/20/20  Yes Just, Azalee Course, FNP  albuterol (VENTOLIN HFA) 108 (90 Base) MCG/ACT inhaler Inhale 2 puffs into the lungs every 6 (six) hours as needed for wheezing or shortness of breath. 02/20/20   Just, Azalee Course, FNP  fluticasone (FLONASE) 50 MCG/ACT nasal spray Place 2 sprays into both nostrils daily. 02/20/20   Just, Azalee Course, FNP  norethindrone-ethinyl estradiol (LOESTRIN FE) 1-20 MG-MCG tablet Take 1 tablet by mouth daily. Patient not taking: No sig reported 07/31/19   [name], MD    Family History Family History  Problem  Relation Age of Onset  . Hypertension Father   . Cancer Maternal Grandmother        lung   . Cancer Paternal Grandmother   . Cancer Paternal Grandfather     Social History Social History   Tobacco Use  . Smoking status: Never Smoker  . Smokeless tobacco: Never Used  Vaping Use  . Vaping Use: Never used  Substance Use Topics  . Alcohol use: No  . Drug use: No     Allergies   Mushroom extract complex   Review of Systems Review of Systems  As stated above in HPI Physical Exam Triage Vital Signs ED Triage Vitals  Enc Vitals Group     BP 03/29/20 1155 (!) 142/87     Pulse Rate 03/29/20 1155 93     Resp 03/29/20 1155 20     Temp 03/29/20 1155 99 F (37.2 C)     Temp Source 03/29/20 1155 Oral     SpO2 03/29/20 1155 99 %     Weight --      Height --      Head Circumference --      Peak Flow --  Pain Score 03/29/20 1156 10     Pain Loc --      Pain Edu? --      Excl. in GC? --    No data found.  Updated Vital Signs BP (!) 142/87 (BP Location: Left Arm)   Pulse 93   Temp 99 F (37.2 C) (Oral)   Resp 20   LMP 03/15/2020   SpO2 99%   Physical Exam Vitals and nursing note reviewed.  Constitutional:      General: She is in acute distress.     Appearance: She is ill-appearing and toxic-appearing. She is not diaphoretic.  HENT:     Head: Normocephalic and atraumatic.     Right Ear: External ear normal.     Left Ear: External ear normal.     Nose: Congestion and rhinorrhea present.     Mouth/Throat:     Mouth: Mucous membranes are moist. No angioedema.     Dentition: Abnormal dentition. Dental tenderness, gingival swelling, dental caries and gum lesions present.     Tongue: No lesions. Tongue does not deviate from midline.     Palate: No mass and lesions.     Pharynx: Oropharynx is clear. Uvula midline. No pharyngeal swelling, oropharyngeal exudate, posterior oropharyngeal erythema or uvula swelling.     Tonsils: No tonsillar exudate or tonsillar  abscesses.      Comments: Inferior palate is tender to palpation with mild bogginess of the left side.  Neurological:     Mental Status: She is alert.      UC Treatments / Results  Labs (all labs ordered are listed, but only abnormal results are displayed) Labs Reviewed - No data to display  EKG   Radiology No results found.  Procedures Procedures (including critical care time)  Medications Ordered in UC Medications  ketorolac (TORADOL) 30 MG/ML injection 30 mg (has no administration in time range)    Initial Impression / Assessment and Plan / UC Course  I have reviewed the triage vital signs and the nursing notes.  Pertinent labs & imaging results that were available during my care of the patient were reviewed by me and considered in my medical decision making (see chart for details).     New. Concern for beginning Ludwigs which I have discussed with mother and daughter. She will need to be evaluated in the emergency room as she likely will need to be observed and treated with IV antibiotics. Toradol given in clinic to help with pain and to act as an antiinflammatory. Private vehicle transfer requested and approved as her airway is clear and we share a parking lot with the emergency room. They agree to go straight from our office to the emergency room.  Final Clinical Impressions(s) / UC Diagnoses   Final diagnoses:  Pain, dental  Mouth swelling  Nausea and vomiting, intractability of vomiting not specified, unspecified vomiting type  Low grade fever     Discharge Instructions     Please go directly to the Emergency Room   ED Prescriptions    None     PDMP not reviewed this encounter.   Rushie Chestnut, [place] 03/29/20 1307

## 2020-03-31 ENCOUNTER — Encounter: Payer: PRIVATE HEALTH INSURANCE | Admitting: Obstetrics and Gynecology

## 2020-03-31 ENCOUNTER — Encounter: Payer: PRIVATE HEALTH INSURANCE | Admitting: Obstetrics & Gynecology

## 2020-04-22 ENCOUNTER — Encounter: Payer: Self-pay | Admitting: Family Medicine

## 2020-04-22 ENCOUNTER — Ambulatory Visit: Payer: BC Managed Care – PPO | Admitting: Family Medicine

## 2020-04-22 VITALS — BP 110/72 | HR 86

## 2020-04-22 DIAGNOSIS — N3001 Acute cystitis with hematuria: Secondary | ICD-10-CM

## 2020-04-22 LAB — POCT URINALYSIS DIP (CLINITEK)
Bilirubin, UA: NEGATIVE
Glucose, UA: NEGATIVE mg/dL
Nitrite, UA: NEGATIVE
POC PROTEIN,UA: NEGATIVE
Spec Grav, UA: 1.02 (ref 1.010–1.025)
Urobilinogen, UA: 0.2 E.U./dL
pH, UA: 5 (ref 5.0–8.0)

## 2020-04-22 LAB — POCT URINE PREGNANCY: Preg Test, Ur: NEGATIVE

## 2020-04-22 MED ORDER — NORETHIN ACE-ETH ESTRAD-FE 1-20 MG-MCG PO TABS: 1.0000 | ORAL_TABLET | Freq: Every day | ORAL | 11 refills | Status: DC

## 2020-04-22 MED ORDER — PHENAZOPYRIDINE HCL 200 MG PO TABS: 200.0000 mg | ORAL_TABLET | Freq: Three times a day (TID) | ORAL | 0 refills | Status: DC | PRN

## 2020-04-22 MED ORDER — NITROFURANTOIN MONOHYD MACRO 100 MG PO CAPS: 100.0000 mg | ORAL_CAPSULE | Freq: Two times a day (BID) | ORAL | 0 refills | Status: AC

## 2020-04-22 MED ORDER — ONDANSETRON HCL 4 MG PO TABS: 4.0000 mg | ORAL_TABLET | Freq: Three times a day (TID) | ORAL | 0 refills | Status: DC | PRN

## 2020-04-22 NOTE — Patient Instructions (Signed)
Urinary Tract Infection, Adult A urinary tract infection (UTI) is an infection of any part of the urinary tract. The urinary tract includes:  The kidneys.  The ureters.  The bladder.  The urethra. These organs make, store, and get rid of pee (urine) in the body. What are the causes? This infection is caused by germs (bacteria) in your genital area. These germs grow and cause swelling (inflammation) of your urinary tract. What increases the risk? The following factors may make you more likely to develop this condition:  Using a small, thin tube (catheter) to drain pee.  Not being able to control when you pee or poop (incontinence).  Being female. If you are female, these things can increase the risk: ? Using these methods to prevent pregnancy:  A medicine that kills sperm (spermicide).  A device that blocks sperm (diaphragm). ? Having low levels of a female hormone (estrogen). ? Being pregnant. You are more likely to develop this condition if:  You have genes that add to your risk.  You are sexually active.  You take antibiotic medicines.  You have trouble peeing because of: ? A prostate that is bigger than normal, if you are female. ? A blockage in the part of your body that drains pee from the bladder. ? A kidney stone. ? A nerve condition that affects your bladder. ? Not getting enough to drink. ? Not peeing often enough.  You have other conditions, such as: ? Diabetes. ? A weak disease-fighting system (immune system). ? Sickle cell disease. ? Gout. ? Injury of the spine. What are the signs or symptoms? Symptoms of this condition include:  Needing to pee right away.  Peeing small amounts often.  Pain or burning when peeing.  Blood in the pee.  Pee that smells bad or not like normal.  Trouble peeing.  Pee that is cloudy.  Fluid coming from the vagina, if you are female.  Pain in the belly or lower back. Other symptoms include:  Vomiting.  Not  feeling hungry.  Feeling mixed up (confused). This may be the first symptom in older adults.  Being tired and grouchy (irritable).  A fever.  Watery poop (diarrhea). How is this treated?  Taking antibiotic medicine.  Taking other medicines.  Drinking enough water. In some cases, you may need to see a specialist. Follow these instructions at home: Medicines  Take over-the-counter and prescription medicines only as told by your doctor.  If you were prescribed an antibiotic medicine, take it as told by your doctor. Do not stop taking it even if you start to feel better. General instructions  Make sure you: ? Pee until your bladder is empty. ? Do not hold pee for a long time. ? Empty your bladder after sex. ? Wipe from front to back after peeing or pooping if you are a female. Use each tissue one time when you wipe.  Drink enough fluid to keep your pee pale yellow.  Keep all follow-up visits.   Contact a doctor if:  You do not get better after 1-2 days.  Your symptoms go away and then come back. Get help right away if:  You have very bad back pain.  You have very bad pain in your lower belly.  You have a fever.  You have chills.  You feeling like you will vomit or you vomit. Summary  A urinary tract infection (UTI) is an infection of any part of the urinary tract.  This condition is caused by   germs in your genital area.  There are many risk factors for a UTI.  Treatment includes antibiotic medicines.  Drink enough fluid to keep your pee pale yellow. This information is not intended to replace advice given to you by your health care provider. Make sure you discuss any questions you have with your health care provider. Document Revised: 08/24/2019 Document Reviewed: 08/24/2019 Elsevier Patient Education  2021 [employer]   Oral Contraception Information Oral contraceptive pills (OCPs) are medicines taken by mouth to prevent pregnancy. They work  by:  Preventing the ovaries from releasing eggs.  Thickening mucus in the lower part of the uterus (cervix). This prevents sperm from entering the uterus.  Thinning the lining of the uterus (endometrium). This prevents a fertilized egg from attaching to the endometrium. OCPs are highly effective when taken exactly as prescribed. However, OCPs do not prevent STIs (sexually transmitted infections). Using condoms while on an OCP can help prevent STIs. What happens before starting OCPs? Before you start taking OCPs:  You may have a physical exam, blood test, and Pap test.  Your health care provider will make sure you are a good candidate for oral contraception. OCPs are not a good option for certain women, such as: ? Women who smoke and are older than age 11. ? Women who have or have had certain conditions, such as:  A history of high blood pressure.  Deep vein thrombosis.  Pulmonary embolism.  Stroke.  Cardiovascular disease.  Peripheral vascular disease. Ask your health care provider about the possible side effects of the OCP you may be prescribed. Be aware that it can take 2-3 months for your body to adjust to changes in hormone levels. Types of oral contraception Birth control pills contain the hormones estrogen and progestin (synthetic progesterone) or progestin only. The combination pill This type of pill contains estrogen and progestin hormones.  Conventional contraception pills come in packs of 21 or 28 pills. ? Some packs with 28-day pills contain estrogen and progestin for the first 21-24 days. Hormone-free tablets, called placebos, are taken for the final 4-7 days. You should have menstrual bleeding during the time you take the placebos. ? In packs with 21 tablets, you take no pills for 7 days. Menstrual bleeding occurs during these days. (Some people prefer taking a pill for 28 days to help establish a routine).  Extended-interval contraception pills come in packs of 91  pills. The first 84 tablets have both estrogen and progestin. The last 7 pills are placebos. Menstrual bleeding occurs during the placebo days. With this schedule, menstrual bleeding happens once every 3 months.  Continuous contraception pills come in packs of 28 pills. All pills in the pack contain estrogen and progestin. With this schedule, regular menstrual bleeding does not happen, but there may be spotting or irregular bleeding. Progestin-only pills This type of pill is often called the mini-pill and contains the progestin hormone only. It comes in packs of 28 pills. In some packs, the last 4 pills are placebos. The pill must be taken at the same time every day. This is very important to prevent pregnancy. Menstrual bleeding may not be regular or predictable.   What are the advantages? Oral contraception provides reliable and continuous contraception if taken as directed. It may treat or decrease symptoms of:  Menstrual period cramps.  Irregular menstrual cycle or bleeding.  Heavy menstrual flow.  Abnormal uterine bleeding.  Acne, depending on the type of pill.  Polycystic ovarian syndrome (POS).  Endometriosis.  Iron deficiency anemia.  Premenstrual symptoms, including severe irritability, depression, or anxiety. It also may:  Reduce the risk of endometrial and ovarian cancer.  Be used as emergency contraception.  Prevent ectopic pregnancies and infections of the fallopian tubes. What can make OCPs less effective? OCPs may be less effective if:  You forget to take the pill every day. For progestin-only pills, it is especially important to take the pill at the same time each day. Even taking it 3 hours late can increase the risk of pregnancy.  You have a stomach or intestinal disease that reduces your body's ability to absorb the pill.  You take OCPs with other medicines that make OCPs less effective, such as antibiotics, certain HIV medicines, and some seizure  medicines.  You take expired OCPs.  You forget to restart the pill after 7 days of not taking it. This refers to the packs of 21 pills. What are the side effects and risks? OCPs can sometimes cause side effects, such as:  Headache.  Depression.  Trouble sleeping.  Nausea and vomiting.  Breast tenderness.  Irregular bleeding or spotting during the first several months.  Bloating or fluid retention.  Increase in blood pressure. Combination pills may slightly increase the risk of:  Blood clots.  Heart attack.  Stroke. Follow these instructions at home: Follow instructions from your health care provider about how to start taking your first cycle of OCPs. Depending on when you start the pill, you may need to use a backup form of birth control, such as condoms, during the first week. Make sure you know what steps to take if you forget to take the pill. Summary  Oral contraceptive pills (OCPs) are medicines taken by mouth to prevent pregnancy. They are highly effective when taken exactly as prescribed.  OCPs contain a combination of the hormones estrogen and progestin (synthetic progesterone) or progestin only.  Before you start taking the pill, you may have a physical exam, blood test, and Pap test. Your health care provider will make sure you are a good candidate for oral contraception.  The combination pill may come in a 21-day pack, a 28-day pack, or a 91-day pack. Progestin-only pills come in packs of 28 pills.  OCPs can sometimes cause side effects, such as headache, nausea, breast tenderness, or irregular bleeding. This information is not intended to replace advice given to you by your health care provider. Make sure you discuss any questions you have with your health care provider. Document Revised: 10/12/2019 Document Reviewed: 09/20/2019 Elsevier Patient Education  2021 [employer].

## 2020-04-22 NOTE — Progress Notes (Signed)
Subjective:     Patient ID: Angela Stevenson, female   DOB: 08/20/2000, 20 y.o.   MRN: [id number]  HPI  Ang[facility] for evaluation of N/V, urinary frequency and urgency. She reports she has had intermittent nausea x 1-2 weeks. Has had vomiting three times in the last week. She reports a sensation of bladder pressure x 5 days with urinary frequency and urgency, along with some lower abdominal cramping x 2 days. She denies fever/chills, no back pain, no hematuria, no vaginal bleeding or discharge. Normal bowel movements. She reports she is sexually active, 1 female partner, no form of birth control. LMP is 3/19, was a little lighter and shorter than usual.   She was at the ED on 03/29/20 for dental pain and given clindamycin for dental infection, she stopped taking the clindamycin after a few days when she started feeling better but restarted it recently and will be done with course today. She is only taking ibuprofen for pain prn. She is not taking the norco that was rx.   She does not currently have a PCP, states she moved here recently from [place]. She is interested in starting birth control.   Past Medical History:  Diagnosis Date  . Asthma   . Asthma 2013   Patient reported  . RA (rheumatoid arthritis) (HCC)    Allergies  Allergen Reactions  . Mushroom Extract Complex Swelling    Numbness And redness     Current Outpatient Medications:  .  nitrofurantoin, macrocrystal-monohydrate, (MACROBID) 100 MG capsule, Take 1 capsule (100 mg total) by mouth 2 (two) times daily for 5 days., Disp: 10 capsule, Rfl: 0 .  norethindrone-ethinyl estradiol (JUNEL FE 1/20) 1-20 MG-MCG tablet, Take 1 tablet by mouth daily., Disp: 28 tablet, Rfl: 11 .  ondansetron (ZOFRAN) 4 MG tablet, Take 1 tablet (4 mg total) by mouth every 8 (eight) hours as needed for nausea or vomiting., Disp: 10 tablet, Rfl: 0 .  phenazopyridine (PYRIDIUM) 200 MG tablet, Take 1 tablet (200 mg total)  by mouth 3 (three) times daily as needed for pain., Disp: 10 tablet, Rfl: 0 .  albuterol (VENTOLIN HFA) 108 (90 Base) MCG/ACT inhaler, Inhale 2 puffs into the lungs every 6 (six) hours as needed for wheezing or shortness of breath. (Patient not taking: Reported on 04/22/2020), Disp: 8 g, Rfl: 2 .  cetirizine (ZYRTEC) 10 MG tablet, Take 1 tablet (10 mg total) by mouth at bedtime. (Patient not taking: Reported on 04/22/2020), Disp: 45 tablet, Rfl: 3 .  fluticasone (FLONASE) 50 MCG/ACT nasal spray, Place 2 sprays into both nostrils daily. (Patient not taking: Reported on 04/22/2020), Disp: 16 g, Rfl: 6  Review of Systems  Constitutional: Negative for activity change, appetite change, chills, fever and unexpected weight change.  HENT: Negative.   Respiratory: Negative.   Cardiovascular: Negative.   Gastrointestinal: Positive for abdominal pain, nausea and vomiting. Negative for blood in stool, constipation and diarrhea.  Genitourinary: Positive for dysuria, frequency and urgency. Negative for difficulty urinating, flank pain, hematuria, menstrual problem, vaginal bleeding and vaginal discharge.       Objective:   Physical Exam Vitals reviewed.  Constitutional:      General: She is not in acute distress.    Appearance: Normal appearance. She is well-developed.  HENT:     Head: Normocephalic and atraumatic.  Eyes:     General:        Right eye: No discharge.  Left eye: No discharge.  Cardiovascular:     Rate and Rhythm: Normal rate and regular rhythm.     Heart sounds: Normal heart sounds.  Pulmonary:     Effort: Pulmonary effort is normal. No respiratory distress.     Breath sounds: Normal breath sounds.  Abdominal:     General: Abdomen is flat. Bowel sounds are normal. There is no distension.     Palpations: Abdomen is soft. There is no mass.     Tenderness: There is abdominal tenderness (mild discomfort over suprapubic area, pt states it makes her feel like she has to urinate).  There is no right CVA tenderness, left CVA tenderness, guarding or rebound.  Genitourinary:    Comments: Unable to perform pelvic exam in clinic Musculoskeletal:     Cervical back: Neck supple.  Skin:    General: Skin is warm and dry.  Neurological:     Mental Status: She is alert and oriented to person, place, and time.  Psychiatric:        Mood and Affect: Mood normal.        Behavior: Behavior normal.    Today's Vitals   04/22/20 0938  BP: 110/72  Pulse: 86  SpO2: 98%   There is no height or weight on file to calculate BMI. Results for orders placed or performed in visit on 04/22/20  POCT URINALYSIS DIP (CLINITEK)  Result Value Ref Range   Color, UA yellow yellow   Clarity, UA clear clear   Glucose, UA negative negative mg/dL   Bilirubin, UA negative negative   Ketones, POC UA moderate (40) (A) negative mg/dL   Spec Grav, UA 6.226 3.335 - 1.025   Blood, UA moderate (A) negative   pH, UA 5.0 5.0 - 8.0   POC PROTEIN,UA negative negative, trace   Urobilinogen, UA 0.2 0.2 or 1.0 E.U./dL   Nitrite, UA Negative Negative   Leukocytes, UA Moderate (2+) (A) Negative  POCT urine pregnancy  Result Value Ref Range   Preg Test, Ur Negative Negative        Assessment:     Acute cystitis with hematuria - Plan: POCT URINALYSIS DIP (CLINITEK), POCT urine pregnancy      Plan:     1. Will treat for UTI, macrobid x 5 days prescribed; urine culture sent awaiting results. Also sent in rx for pyridium and zofran prn. Encouraged increased water intake. Warning signs discussed. Go to ED if develop fever, severe abdominal pain, flank pain, or frank blood in urine. Should her symptoms not improve recommend f/u with PCP or urgent care where she can get a pelvic exam completed for further evaluation.  2. Strongly encouraged birth control. Pt would like to start on OCP. Rx sent in, discussed proper administration. Encouraged pt to establish care with PCP locally.

## 2020-05-30 ENCOUNTER — Other Ambulatory Visit: Payer: Self-pay

## 2020-05-30 ENCOUNTER — Encounter (HOSPITAL_COMMUNITY): Payer: Self-pay | Admitting: Physician Assistant

## 2020-05-30 ENCOUNTER — Ambulatory Visit (HOSPITAL_COMMUNITY)
Admission: EM | Admit: 2020-05-30 | Discharge: 2020-05-30 | Disposition: A | Discharge status: Home / Self Care | Payer: PRIVATE HEALTH INSURANCE | Attending: Internal Medicine | Admitting: Internal Medicine

## 2020-05-30 DIAGNOSIS — L509 Urticaria, unspecified: Secondary | ICD-10-CM

## 2020-05-30 DIAGNOSIS — B9689 Other specified bacterial agents as the cause of diseases classified elsewhere: Secondary | ICD-10-CM

## 2020-05-30 DIAGNOSIS — R8290 Unspecified abnormal findings in urine: Secondary | ICD-10-CM | POA: Diagnosis present

## 2020-05-30 DIAGNOSIS — N3001 Acute cystitis with hematuria: Secondary | ICD-10-CM

## 2020-05-30 LAB — POCT URINALYSIS DIPSTICK, ED / UC
Bilirubin Urine: NEGATIVE
Glucose, UA: NEGATIVE mg/dL
Ketones, ur: NEGATIVE mg/dL
Nitrite: POSITIVE — AB
Protein, ur: NEGATIVE mg/dL
Specific Gravity, Urine: 1.02 (ref 1.005–1.030)
Urobilinogen, UA: 0.2 mg/dL (ref 0.0–1.0)
pH: 5.5 (ref 5.0–8.0)

## 2020-05-30 MED ORDER — SULFAMETHOXAZOLE-TRIMETHOPRIM 800-160 MG PO TABS: 1.0000 | ORAL_TABLET | Freq: Two times a day (BID) | ORAL | 0 refills | Status: AC

## 2020-05-30 MED ORDER — PREDNISONE 10 MG (21) PO TBPK: ORAL_TABLET | ORAL | 0 refills | Status: DC

## 2020-05-30 MED ORDER — METHYLPREDNISOLONE ACETATE 80 MG/ML IJ SUSP
60.0000 mg | Freq: Once | INTRAMUSCULAR | Status: AC
  Administered 2020-05-30: 60 mg via INTRAMUSCULAR

## 2020-05-30 MED ORDER — HYDROXYZINE HCL 25 MG PO TABS: 25.0000 mg | ORAL_TABLET | Freq: Three times a day (TID) | ORAL | 0 refills | Status: DC | PRN

## 2020-05-30 MED ORDER — METHYLPREDNISOLONE ACETATE 80 MG/ML IJ SUSP
INTRAMUSCULAR | Status: AC
  Filled 2020-05-30: qty 1

## 2020-05-30 NOTE — Discharge Instructions (Addendum)
You were given steroids today.  Please begin prednisone tomorrow.  While you are taking prednisone do not take ibuprofen, aspirin, Aleve due to risk of GI bleeding.  You can use hydroxyzine up to 3 times a day as needed for itching symptoms but this will make you sleepy so do not drive or drink alcohol with it.  Use hypoallergenic soaps and detergents.  If you develop any worsening symptoms you need to be reevaluated immediately.  We are going to start you on Bactrim DS to cover for urinary tract infection.  If you develop any worsening symptoms including worsening rash, shortness of breath you need to go to the emergency room.

## 2020-05-30 NOTE — ED Provider Notes (Signed)
[facility]    CSN: [id number] Arrival date & time: 05/30/20  1144      History   Chief Complaint Chief Complaint  Patient presents with  . Allergic Reaction  . UTI sxs  . Facial Swelling    HPI Angela Stevenson is a 20 y.o. female.   Patient presents today with a several hour history of widespread pruritic rash.  Reports she was awoken by symptoms at 3 AM this morning.  She scratched a little and then went back to sleep and when she woke up at 5 AM to get ready for work she had hives all over her body.  She denies any changes to personal hygiene products, soaps, detergents.  Denies any recent medication changes.  Denies any new foods.  Denies exposure to pets or different allergens.  Denies any recent travel.  She does have a history of hives related to medication prescribed by her dentist many years ago.  Her mother often gets hives when she is stressed and anxious and patient does report increased stress level at this time.  She denies any shortness of breath, muffled voice, difficulty swallowing.  In addition, patient reports prolonged history of malodorous urine.  She was seen March 2022 and prescribed antibiotics but did not pick them up due to problem with her pharmacy.  She reports her UTI symptoms have resolved with the exception of persistent malodorous urine.  She is interested in having urine checked today.  Denies any abdominal pain, fever, hematuria, flank pain, nausea, vomiting.     Past Medical History:  Diagnosis Date  . Asthma   . Asthma 2013   Patient reported  . RA (rheumatoid arthritis) [facility])     Patient Active Problem List   Diagnosis Date Noted  . History of rheumatoid arthritis 10/31/2018  . Dizziness and giddiness 05/09/2018    History reviewed. No pertinent surgical history.  OB History    Gravida  0   Para  0   Term  0   Preterm  0   AB  0   Living  0     SAB  0   IAB  0   Ectopic  0   Multiple  0   Live Births  0             Home Medications    Prior to Admission medications   Medication Sig Start Date End Date Taking? Authorizing Provider  cetirizine (ZYRTEC) 10 MG tablet Take 1 tablet (10 mg total) by mouth at bedtime. 02/20/20  Yes Just, Azalee Course, FNP  hydrOXYzine (ATARAX/VISTARIL) 25 MG tablet Take 1 tablet (25 mg total) by mouth every 8 (eight) hours as needed for itching. 05/30/20  Yes Whittaker Lenis, Noberto Retort, PA-C  phenazopyridine (PYRIDIUM) 200 MG tablet Take 1 tablet (200 mg total) by mouth 3 (three) times daily as needed for pain. 04/22/20  Yes Jeannine Boga, NP  predniSONE (STERAPRED UNI-PAK 21 TAB) 10 MG (21) TBPK tablet As directed 05/30/20  Yes Bennet Kujawa K, PA-C  sulfamethoxazole-trimethoprim (BACTRIM DS) 800-160 MG tablet Take 1 tablet by mouth 2 (two) times daily for 7 days. 05/30/20 06/06/20 Yes Starlena Beil K, PA-C  albuterol (VENTOLIN HFA) 108 (90 Base) MCG/ACT inhaler Inhale 2 puffs into the lungs every 6 (six) hours as needed for wheezing or shortness of breath. Patient not taking: Reported on 04/22/2020 02/20/20   Just, Azalee Course, FNP  fluticasone (FLONASE) 50 MCG/ACT nasal spray Place 2 sprays into both nostrils daily.  Patient not taking: Reported on 04/22/2020 02/20/20   Just, Azalee Course, FNP  norethindrone-ethinyl estradiol (JUNEL FE 1/20) 1-20 MG-MCG tablet Take 1 tablet by mouth daily. 04/22/20   Jeannine Boga, NP  ondansetron (ZOFRAN) 4 MG tablet Take 1 tablet (4 mg total) by mouth every 8 (eight) hours as needed for nausea or vomiting. 04/22/20   Jeannine Boga, NP    Family History Family History  Problem Relation Age of Onset  . Hypertension Father   . Cancer Maternal Grandmother        lung   . Cancer Paternal Grandmother   . Cancer Paternal Grandfather     Social History Social History   Tobacco Use  . Smoking status: Never Smoker  . Smokeless tobacco: Never Used  Vaping Use  . Vaping Use: Never used  Substance Use Topics  . Alcohol use: No  . Drug use: No      Allergies   Celery oil and Mushroom extract complex   Review of Systems Review of Systems  Constitutional: Positive for activity change. Negative for appetite change, fatigue and fever.  HENT: Negative for congestion, sinus pressure, sneezing, sore throat, trouble swallowing and voice change.   Respiratory: Negative for cough and shortness of breath.   Cardiovascular: Negative for chest pain.  Gastrointestinal: Negative for abdominal pain, diarrhea, nausea and vomiting.  Genitourinary: Negative for dysuria, frequency and urgency.  Skin: Positive for rash.  Neurological: Negative for dizziness, light-headedness and headaches.     Physical Exam Triage Vital Signs ED Triage Vitals  Enc Vitals Group     BP 05/30/20 1222 116/66     Pulse Rate 05/30/20 1222 82     Resp 05/30/20 1222 18     Temp 05/30/20 1222 98.2 F (36.8 C)     Temp src --      SpO2 05/30/20 1222 99 %     Weight --      Height --      Head Circumference --      Peak Flow --      Pain Score 05/30/20 1218 3     Pain Loc --      Pain Edu? --      Excl. in GC? --    No data found.  Updated Vital Signs BP 116/66   Pulse 82   Temp 98.2 F (36.8 C)   Resp 18   LMP 05/11/2020   SpO2 99%   Visual Acuity Right Eye Distance:   Left Eye Distance:   Bilateral Distance:    Right Eye Near:   Left Eye Near:    Bilateral Near:     Physical Exam Vitals reviewed.  Constitutional:      General: She is awake. She is not in acute distress.    Appearance: Normal appearance. She is not ill-appearing.     Comments: Very pleasant female appears stated age in no acute distress  HENT:     Head: Normocephalic and atraumatic.     Right Ear: Tympanic membrane, ear canal and external ear normal.     Left Ear: Tympanic membrane, ear canal and external ear normal.     Nose: Nose normal.     Mouth/Throat:     Mouth: Mucous membranes are moist.     Pharynx: Uvula midline. No oropharyngeal exudate or posterior  oropharyngeal erythema.  Cardiovascular:     Rate and Rhythm: Normal rate and regular rhythm.     Heart sounds: No murmur heard.  Pulmonary:     Effort: Pulmonary effort is normal.     Breath sounds: Normal breath sounds. No stridor. No wheezing, rhonchi or rales.     Comments: Clear to auscultation bilaterally Abdominal:     General: Bowel sounds are normal.     Palpations: Abdomen is soft.     Tenderness: There is no abdominal tenderness. There is no right CVA tenderness, left CVA tenderness, guarding or rebound.     Comments: Benign abdominal exam  Skin:    Findings: Rash present. Rash is urticarial.     Comments: Widespread urticarial rash  Psychiatric:        Behavior: Behavior is cooperative.      UC Treatments / Results  Labs (all labs ordered are listed, but only abnormal results are displayed) Labs Reviewed  POCT URINALYSIS DIPSTICK, ED / UC - Abnormal; Notable for the following components:      Result Value   Hgb urine dipstick TRACE (*)    Nitrite POSITIVE (*)    Leukocytes,Ua TRACE (*)    All other components within normal limits  URINE CULTURE    EKG   Radiology No results found.  Procedures Procedures (including critical care time)  Medications Ordered in UC Medications  methylPREDNISolone acetate (DEPO-MEDROL) injection 60 mg (60 mg Intramuscular Given 05/30/20 1348)    Initial Impression / Assessment and Plan / UC Course  I have reviewed the triage vital signs and the nursing notes.  Pertinent labs & imaging results that were available during my care of the patient were reviewed by me and considered in my medical decision making (see chart for details).     Patient given Depo-Medrol in office with improvement of symptoms.  Will start prednisone taper and patient was started on to take NSAIDs with this medication.  She was encouraged to use hypoallergenic soaps and detergents.  She was given hydroxyzine for pruritus with instruction not to drive  or drink alcohol with this medication as drowsiness is a common side effect.  Unfortunately, her UA continues to show evidence of infection so we will treat with Bactrim DS as she is safely taking this medication before several years ago without side effect.  Urine culture was obtained and we discussed potential need to change antibiotics based on susceptibilities identified on culture.  Strict return precautions given to which patient expressed understanding.  Final Clinical Impressions(s) / UC Diagnoses   Final diagnoses:  Urticaria  Malodorous urine  Acute cystitis with hematuria     Discharge Instructions     You were given steroids today.  Please begin prednisone tomorrow.  While you are taking prednisone do not take ibuprofen, aspirin, Aleve due to risk of GI bleeding.  You can use hydroxyzine up to 3 times a day as needed for itching symptoms but this will make you sleepy so do not drive or drink alcohol with it.  Use hypoallergenic soaps and detergents.  If you develop any worsening symptoms you need to be reevaluated immediately.  We are going to start you on Bactrim DS to cover for urinary tract infection.  If you develop any worsening symptoms including worsening rash, shortness of breath you need to go to the emergency room.    ED Prescriptions    Medication Sig Dispense Auth. Provider   predniSONE (STERAPRED UNI-PAK 21 TAB) 10 MG (21) TBPK tablet As directed 21 tablet Yomara Toothman K, PA-C   hydrOXYzine (ATARAX/VISTARIL) 25 MG tablet Take 1 tablet (25 mg total) by mouth  every 8 (eight) hours as needed for itching. 12 tablet Mishell Donalson K, PA-C   sulfamethoxazole-trimethoprim (BACTRIM DS) 800-160 MG tablet Take 1 tablet by mouth 2 (two) times daily for 7 days. 14 tablet Leeta Grimme, Noberto Retort, PA-C     PDMP not reviewed this encounter.   Jeani Hawking, PA-C 05/30/20 1356

## 2020-05-30 NOTE — ED Triage Notes (Addendum)
Pt reports woke up at 5am with hives everywhere- stomach, back, genitals, face. Pt itching in triage. Pt also reports burning in throat with some pain in throat. Pt however reports having a tooth infection. States was here for tooth infection but was sent to the ER for swelling which was so bad at that point pt could not talk. Pt states that swelling is coming back on left side extending under chin.   Pt also reports she was prescribed antibiotics in the middle of April for uti but was never able to pick them up. Pt states all symptoms have resolved aside from foul smelling urine. Pt states has not been able to take any of her medicines due to an issue with her pharmacy.

## 2020-06-01 LAB — URINE CULTURE: Culture: >=100000 — AB

## 2020-07-02 ENCOUNTER — Ambulatory Visit: Payer: PRIVATE HEALTH INSURANCE | Admitting: Obstetrics & Gynecology

## 2020-07-02 DIAGNOSIS — Z0289 Encounter for other administrative examinations: Secondary | ICD-10-CM

## 2020-08-09 ENCOUNTER — Emergency Department (HOSPITAL_COMMUNITY): Payer: BC Managed Care – PPO

## 2020-08-09 ENCOUNTER — Other Ambulatory Visit: Payer: Self-pay

## 2020-08-09 ENCOUNTER — Emergency Department (HOSPITAL_COMMUNITY)
Admission: EM | Admit: 2020-08-09 | Discharge: 2020-08-10 | Disposition: A | Discharge status: Home / Self Care | Payer: BC Managed Care – PPO | Attending: Emergency Medicine | Admitting: Emergency Medicine

## 2020-08-09 ENCOUNTER — Encounter (HOSPITAL_COMMUNITY): Payer: Self-pay

## 2020-08-09 DIAGNOSIS — Y92481 Parking lot as the place of occurrence of the external cause: Secondary | ICD-10-CM | POA: Diagnosis not present

## 2020-08-09 DIAGNOSIS — M25512 Pain in left shoulder: Secondary | ICD-10-CM | POA: Insufficient documentation

## 2020-08-09 DIAGNOSIS — R42 Dizziness and giddiness: Secondary | ICD-10-CM | POA: Insufficient documentation

## 2020-08-09 DIAGNOSIS — R110 Nausea: Secondary | ICD-10-CM | POA: Diagnosis not present

## 2020-08-09 DIAGNOSIS — S161XXA Strain of muscle, fascia and tendon at neck level, initial encounter: Secondary | ICD-10-CM | POA: Insufficient documentation

## 2020-08-09 DIAGNOSIS — J45909 Unspecified asthma, uncomplicated: Secondary | ICD-10-CM | POA: Diagnosis not present

## 2020-08-09 DIAGNOSIS — R519 Headache, unspecified: Secondary | ICD-10-CM | POA: Insufficient documentation

## 2020-08-09 DIAGNOSIS — S199XXA Unspecified injury of neck, initial encounter: Secondary | ICD-10-CM | POA: Diagnosis present
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
## 2020-08-10 MED ORDER — CYCLOBENZAPRINE HCL 10 MG PO TABS: 10.0000 mg | ORAL_TABLET | Freq: Two times a day (BID) | ORAL | 0 refills | Status: DC | PRN

## 2020-08-10 MED ORDER — NAPROXEN 500 MG PO TABS: 500.0000 mg | ORAL_TABLET | Freq: Two times a day (BID) | ORAL | 0 refills | Status: DC

## 2020-10-14 ENCOUNTER — Ambulatory Visit: Payer: PRIVATE HEALTH INSURANCE | Admitting: Obstetrics & Gynecology

## 2020-11-13 ENCOUNTER — Other Ambulatory Visit: Payer: Self-pay

## 2020-11-13 ENCOUNTER — Ambulatory Visit
Admission: RE | Admit: 2020-11-13 | Discharge: 2020-11-13 | Disposition: A | Discharge status: Home / Self Care | Payer: PRIVATE HEALTH INSURANCE | Source: Ambulatory Visit | Attending: Family Medicine | Admitting: Family Medicine

## 2020-11-13 ENCOUNTER — Other Ambulatory Visit: Payer: Self-pay | Admitting: Family Medicine

## 2020-11-13 DIAGNOSIS — S43004A Unspecified dislocation of right shoulder joint, initial encounter: Secondary | ICD-10-CM

## 2020-11-14 DIAGNOSIS — M25311 Other instability, right shoulder: Secondary | ICD-10-CM | POA: Diagnosis not present

## 2020-11-26 DIAGNOSIS — M25511 Pain in right shoulder: Secondary | ICD-10-CM | POA: Diagnosis not present

## 2020-11-28 DIAGNOSIS — M25311 Other instability, right shoulder: Secondary | ICD-10-CM | POA: Diagnosis not present

## 2020-12-11 ENCOUNTER — Encounter (HOSPITAL_BASED_OUTPATIENT_CLINIC_OR_DEPARTMENT_OTHER): Payer: Self-pay | Admitting: Orthopedic Surgery

## 2020-12-11 ENCOUNTER — Other Ambulatory Visit: Payer: Self-pay

## 2020-12-17 NOTE — Progress Notes (Signed)
Left message with Elnita Maxwell at Dr. Shelba Flake office to request orders.

## 2020-12-22 NOTE — H&P (Signed)
PREOPERATIVE H&P  Chief Complaint: right shoulder instability  HPI: Angela Stevenson is a 20 y.o. 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 The most recent dislocation was on 11-13-20. She reduced this herself. She had a cheerleading injury in 2016 and she has had multiple shoulder dislocations, so many she cannot even count the number. She is frustrated this happens even with small movements. She has tried working with a Psychologist, educational and in the gym in the past. This has not helped her stability and pain. 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 She wants to do her best to make sure this does not happen in the future. This is significantly impairing activities of daily living.  She has elected for surgical management.   Past Medical History:  Diagnosis Date   Asthma    Asthma 2013   Patient reported   RA (rheumatoid arthritis) ([facility])    History reviewed. No pertinent surgical history. Social History   Socioeconomic History   Marital status: Single    Spouse name: Not on file   Number of children: 0   Years of education: Not on file   Highest education level: Not on file  Occupational History   Not on file  Tobacco Use   Smoking status: Never   Smokeless tobacco: Never  Vaping Use   Vaping Use: Never used  Substance and Sexual Activity   Alcohol use: Yes    Comment: occasionally   Drug use: No   Sexual activity: Yes    Partners: Male    Comment: 1ST intercourse- 10, partners- 2, current partner- 3 months   Other Topics Concern   Not on file  Social History Narrative   Not on file   Social Determinants of Health   Financial Resource Strain: Not on file  Food Insecurity: Not on file  Transportation Needs: Not on file  Physical Activity: Not on file  Stress: Not on file  Social Connections: Not on file   Family History  Problem Relation Age of Onset   Hypertension Father    Cancer Maternal Grandmother        lung     Cancer Paternal Grandmother    Cancer Paternal Grandfather    Allergies  Allergen Reactions   Celery Oil Other (See Comments)    Pt allergic to celery, states tongue gets numb only   Mushroom Extract Complex Swelling    Numbness And redness    Prior to Admission medications   Medication Sig Start Date End Date Taking? Authorizing Provider  albuterol (VENTOLIN HFA) 108 (90 Base) MCG/ACT inhaler Inhale 2 puffs into the lungs every 6 (six) hours as needed for wheezing or shortness of breath. 02/20/20  Yes Just, Azalee Course, FNP     Positive ROS: All other systems have been reviewed and were otherwise negative with the exception of those mentioned in the HPI and as above.  Physical Exam: General: Alert, no acute distress Cardiovascular: No pedal edema Respiratory: No cyanosis, no use of accessory musculature GI: No organomegaly, abdomen is soft and non-tender Skin: No lesions in the area of chief complaint Neurologic: Sensation intact distally Psychiatric: Patient is competent for consent with normal mood and affect Lymphatic: No axillary or cervical lymphadenopathy  MUSCULOSKELETAL: On examination she has 0-170 degrees of forward flexion at the right shoulder. Internal rotation to T10 and 60 degrees of external rotation. She is able to flex, extend and abduct all fingers of  her right hand and make a fist. Distal sensation is intact.   Assessment: Right shoulder instability  I reviewed the MRI with the patient which shows a Bankart tear of the labrum. Mild Hill Sach's impaction but no significant bone loss.     Plan: Plan for Procedure(s): ARTHROSCOPIC BANKART REPAIR  The risks benefits and alternatives were discussed with the patient including but not limited to the risks of nonoperative treatment, versus surgical intervention including infection, bleeding, nerve injury,  blood clots, cardiopulmonary complications, morbidity, mortality, among others, and they were willing to  proceed.    Armida Sans, PA-C    12/22/2020 9:45 AM

## 2020-12-23 ENCOUNTER — Ambulatory Visit (HOSPITAL_BASED_OUTPATIENT_CLINIC_OR_DEPARTMENT_OTHER): Payer: 59 | Admitting: Anesthesiology

## 2020-12-23 ENCOUNTER — Ambulatory Visit (HOSPITAL_BASED_OUTPATIENT_CLINIC_OR_DEPARTMENT_OTHER)
Admission: RE | Admit: 2020-12-23 | Discharge: 2020-12-23 | Disposition: A | Discharge status: Home / Self Care | Payer: 59 | Attending: Orthopedic Surgery | Admitting: Orthopedic Surgery

## 2020-12-23 ENCOUNTER — Other Ambulatory Visit: Payer: Self-pay

## 2020-12-23 ENCOUNTER — Encounter (HOSPITAL_BASED_OUTPATIENT_CLINIC_OR_DEPARTMENT_OTHER): Payer: Self-pay | Admitting: Orthopedic Surgery

## 2020-12-23 ENCOUNTER — Encounter (HOSPITAL_BASED_OUTPATIENT_CLINIC_OR_DEPARTMENT_OTHER)
Admission: RE | Disposition: A | Discharge status: Home / Self Care | Payer: Self-pay | Source: Home / Self Care | Attending: Orthopedic Surgery

## 2020-12-23 DIAGNOSIS — M25311 Other instability, right shoulder: Secondary | ICD-10-CM | POA: Insufficient documentation

## 2020-12-23 DIAGNOSIS — S43431A Superior glenoid labrum lesion of right shoulder, initial encounter: Secondary | ICD-10-CM | POA: Diagnosis not present

## 2020-12-23 DIAGNOSIS — S43004A Unspecified dislocation of right shoulder joint, initial encounter: Secondary | ICD-10-CM | POA: Diagnosis not present

## 2020-12-23 DIAGNOSIS — Z01818 Encounter for other preprocedural examination: Secondary | ICD-10-CM

## 2020-12-23 DIAGNOSIS — J45909 Unspecified asthma, uncomplicated: Secondary | ICD-10-CM | POA: Diagnosis not present

## 2020-12-23 DIAGNOSIS — G8918 Other acute postprocedural pain: Secondary | ICD-10-CM | POA: Diagnosis not present

## 2020-12-23 DIAGNOSIS — M069 Rheumatoid arthritis, unspecified: Secondary | ICD-10-CM | POA: Insufficient documentation

## 2020-12-23 DIAGNOSIS — M24111 Other articular cartilage disorders, right shoulder: Secondary | ICD-10-CM | POA: Diagnosis not present

## 2020-12-23 HISTORY — PX: BANKART REPAIR: SHX5173

## 2020-12-23 SURGERY — REPAIR, SHOULDER, ARTHROSCOPIC, BANKART
Anesthesia: Regional | Site: Hip | Laterality: Right

## 2020-12-23 MED ORDER — ROCURONIUM BROMIDE 100 MG/10ML IV SOLN
INTRAVENOUS | Status: DC | PRN
  Administered 2020-12-23: 60 mg via INTRAVENOUS

## 2020-12-23 MED ORDER — ONDANSETRON HCL 4 MG/2ML IJ SOLN
INTRAMUSCULAR | Status: AC
  Filled 2020-12-23: qty 2

## 2020-12-23 MED ORDER — OXYCODONE HCL 5 MG PO TABS: 5.0000 mg | ORAL_TABLET | Freq: Once | ORAL | Status: DC | PRN

## 2020-12-23 MED ORDER — PROPOFOL 10 MG/ML IV BOLUS
INTRAVENOUS | Status: DC | PRN
  Administered 2020-12-23: 130 mg via INTRAVENOUS

## 2020-12-23 MED ORDER — MIDAZOLAM HCL 5 MG/5ML IJ SOLN
INTRAMUSCULAR | Status: DC | PRN
  Administered 2020-12-23: 1 mg via INTRAVENOUS

## 2020-12-23 MED ORDER — HYDROMORPHONE HCL 1 MG/ML IJ SOLN: 0.2500 mg | INTRAMUSCULAR | Status: DC | PRN

## 2020-12-23 MED ORDER — MIDAZOLAM HCL 2 MG/2ML IJ SOLN
INTRAMUSCULAR | Status: AC
  Filled 2020-12-23: qty 2

## 2020-12-23 MED ORDER — FENTANYL CITRATE (PF) 100 MCG/2ML IJ SOLN
INTRAMUSCULAR | Status: AC
  Filled 2020-12-23: qty 2

## 2020-12-23 MED ORDER — SCOPOLAMINE 1 MG/3DAYS TD PT72
MEDICATED_PATCH | TRANSDERMAL | Status: AC
  Filled 2020-12-23: qty 1

## 2020-12-23 MED ORDER — MIDAZOLAM HCL 2 MG/2ML IJ SOLN
2.0000 mg | Freq: Once | INTRAMUSCULAR | Status: AC
  Administered 2020-12-23: 2 mg via INTRAVENOUS

## 2020-12-23 MED ORDER — LACTATED RINGERS IV SOLN: INTRAVENOUS | Status: DC

## 2020-12-23 MED ORDER — LIDOCAINE 2% (20 MG/ML) 5 ML SYRINGE
INTRAMUSCULAR | Status: AC
  Filled 2020-12-23: qty 5

## 2020-12-23 MED ORDER — SUGAMMADEX SODIUM 200 MG/2ML IV SOLN
INTRAVENOUS | Status: DC | PRN
  Administered 2020-12-23: 150 mg via INTRAVENOUS

## 2020-12-23 MED ORDER — SENNA-DOCUSATE SODIUM 8.6-50 MG PO TABS: 2.0000 | ORAL_TABLET | Freq: Every day | ORAL | 1 refills | Status: DC

## 2020-12-23 MED ORDER — CEFAZOLIN SODIUM-DEXTROSE 2-4 GM/100ML-% IV SOLN
INTRAVENOUS | Status: AC
  Filled 2020-12-23: qty 100

## 2020-12-23 MED ORDER — PHENYLEPHRINE HCL (PRESSORS) 10 MG/ML IV SOLN
INTRAVENOUS | Status: DC | PRN
  Administered 2020-12-23 (×4): 40 ug via INTRAVENOUS

## 2020-12-23 MED ORDER — BUPIVACAINE-EPINEPHRINE (PF) 0.5% -1:200000 IJ SOLN
INTRAMUSCULAR | Status: DC | PRN
  Administered 2020-12-23: 10 mL via PERINEURAL

## 2020-12-23 MED ORDER — AMISULPRIDE (ANTIEMETIC) 5 MG/2ML IV SOLN: 10.0000 mg | Freq: Once | INTRAVENOUS | Status: DC | PRN

## 2020-12-23 MED ORDER — ROCURONIUM BROMIDE 10 MG/ML (PF) SYRINGE
PREFILLED_SYRINGE | INTRAVENOUS | Status: AC
  Filled 2020-12-23: qty 10

## 2020-12-23 MED ORDER — ACETAMINOPHEN 325 MG PO TABS: 650.0000 mg | ORAL_TABLET | Freq: Four times a day (QID) | ORAL | 1 refills | Status: DC | PRN

## 2020-12-23 MED ORDER — PHENYLEPHRINE 40 MCG/ML (10ML) SYRINGE FOR IV PUSH (FOR BLOOD PRESSURE SUPPORT)
PREFILLED_SYRINGE | INTRAVENOUS | Status: AC
  Filled 2020-12-23: qty 10

## 2020-12-23 MED ORDER — ONDANSETRON HCL 4 MG/2ML IJ SOLN
INTRAMUSCULAR | Status: DC | PRN
  Administered 2020-12-23: 4 mg via INTRAVENOUS

## 2020-12-23 MED ORDER — BUPIVACAINE LIPOSOME 1.3 % IJ SUSP
INTRAMUSCULAR | Status: DC | PRN
  Administered 2020-12-23: 10 mL via PERINEURAL

## 2020-12-23 MED ORDER — KETOROLAC TROMETHAMINE 30 MG/ML IJ SOLN: 30.0000 mg | Freq: Once | INTRAMUSCULAR | Status: DC | PRN

## 2020-12-23 MED ORDER — LIDOCAINE HCL (CARDIAC) PF 100 MG/5ML IV SOSY
PREFILLED_SYRINGE | INTRAVENOUS | Status: DC | PRN
  Administered 2020-12-23: 60 mg via INTRAVENOUS

## 2020-12-23 MED ORDER — PROMETHAZINE HCL 25 MG/ML IJ SOLN: 6.2500 mg | INTRAMUSCULAR | Status: DC | PRN

## 2020-12-23 MED ORDER — FENTANYL CITRATE (PF) 100 MCG/2ML IJ SOLN
INTRAMUSCULAR | Status: DC | PRN
  Administered 2020-12-23: 50 ug via INTRAVENOUS

## 2020-12-23 MED ORDER — OXYCODONE HCL 5 MG/5ML PO SOLN: 5.0000 mg | Freq: Once | ORAL | Status: DC | PRN

## 2020-12-23 MED ORDER — POVIDONE-IODINE 10 % EX SWAB: 2.0000 "application " | Freq: Once | CUTANEOUS | Status: DC

## 2020-12-23 MED ORDER — OXYCODONE HCL 5 MG PO TABS: 5.0000 mg | ORAL_TABLET | ORAL | 0 refills | Status: DC | PRN

## 2020-12-23 MED ORDER — CEFAZOLIN SODIUM-DEXTROSE 2-4 GM/100ML-% IV SOLN
2.0000 g | INTRAVENOUS | Status: AC
  Administered 2020-12-23: 2 g via INTRAVENOUS

## 2020-12-23 MED ORDER — ONDANSETRON HCL 4 MG PO TABS: 4.0000 mg | ORAL_TABLET | Freq: Three times a day (TID) | ORAL | 0 refills | Status: DC | PRN

## 2020-12-23 MED ORDER — DEXAMETHASONE SODIUM PHOSPHATE 4 MG/ML IJ SOLN
INTRAMUSCULAR | Status: DC | PRN
  Administered 2020-12-23: 5 mg via INTRAVENOUS

## 2020-12-23 MED ORDER — ACETAMINOPHEN 500 MG PO TABS
1000.0000 mg | ORAL_TABLET | Freq: Once | ORAL | Status: AC
  Administered 2020-12-23: 1000 mg via ORAL

## 2020-12-23 MED ORDER — SCOPOLAMINE 1 MG/3DAYS TD PT72
1.0000 | MEDICATED_PATCH | TRANSDERMAL | Status: DC
  Administered 2020-12-23: 1.5 mg via TRANSDERMAL

## 2020-12-23 MED ORDER — FENTANYL CITRATE (PF) 100 MCG/2ML IJ SOLN
100.0000 ug | Freq: Once | INTRAMUSCULAR | Status: AC
  Administered 2020-12-23: 100 ug via INTRAVENOUS

## 2020-12-23 MED ORDER — MEPERIDINE HCL 25 MG/ML IJ SOLN: 6.2500 mg | INTRAMUSCULAR | Status: DC | PRN

## 2020-12-23 MED ORDER — ACETAMINOPHEN 500 MG PO TABS: 1000.0000 mg | ORAL_TABLET | Freq: Once | ORAL | Status: AC

## 2020-12-23 MED ORDER — SODIUM CHLORIDE 0.9 % IR SOLN
Status: DC | PRN
  Administered 2020-12-23: 12000 mL

## 2020-12-23 MED ORDER — ACETAMINOPHEN 500 MG PO TABS
ORAL_TABLET | ORAL | Status: AC
  Filled 2020-12-23: qty 2

## 2020-12-23 SURGICAL SUPPLY — 61 items
ANCH SUT 2 FBRTK KNTLS 1.8 (Anchor) ×3 IMPLANT
ANCHOR SUT 1.8 FBRTK KNTLS 2SU (Anchor) ×2 IMPLANT
ANCHOR SUT 1.8 FIBERTAK SB KL (Anchor) ×6 IMPLANT
BLADE EXCALIBUR 4.0X13 (MISCELLANEOUS) IMPLANT
BLADE SURG 15 STRL LF DISP TIS (BLADE) IMPLANT
BLADE SURG 15 STRL SS (BLADE)
BURR OVAL 8 FLU 5.0X13 (MISCELLANEOUS) IMPLANT
CANNULA 5.75X71 LONG (CANNULA) ×2 IMPLANT
CANNULA TWIST IN 8.25X7CM (CANNULA) IMPLANT
CLSR STERI-STRIP ANTIMIC 1/2X4 (GAUZE/BANDAGES/DRESSINGS) IMPLANT
COOLER ICEMAN CLASSIC (MISCELLANEOUS) ×2 IMPLANT
DECANTER SPIKE VIAL GLASS SM (MISCELLANEOUS) IMPLANT
DISSECTOR  3.8MM X 13CM (MISCELLANEOUS) ×1
DISSECTOR 3.8MM X 13CM (MISCELLANEOUS) ×1 IMPLANT
DRAPE IMP U-DRAPE 54X76 (DRAPES) ×2 IMPLANT
DRAPE INCISE IOBAN 66X45 STRL (DRAPES) ×2 IMPLANT
DRAPE SHOULDER BEACH CHAIR (DRAPES) ×2 IMPLANT
DRAPE U-SHAPE 47X51 STRL (DRAPES) ×2 IMPLANT
DRSG PAD ABDOMINAL 8X10 ST (GAUZE/BANDAGES/DRESSINGS) ×2 IMPLANT
DURAPREP 26ML APPLICATOR (WOUND CARE) ×2 IMPLANT
FIBERSTICK 2 (SUTURE) IMPLANT
GAUZE SPONGE 4X4 12PLY STRL (GAUZE/BANDAGES/DRESSINGS) ×2 IMPLANT
GLOVE SRG 8 PF TXTR STRL LF DI (GLOVE) ×2 IMPLANT
GLOVE SURG ENC MOIS LTX SZ6.5 (GLOVE) ×2 IMPLANT
GLOVE SURG ENC MOIS LTX SZ7 (GLOVE) ×2 IMPLANT
GLOVE SURG ORTHO LTX SZ7.5 (GLOVE) ×2 IMPLANT
GLOVE SURG UNDER POLY LF SZ7 (GLOVE) ×6 IMPLANT
GLOVE SURG UNDER POLY LF SZ8 (GLOVE) ×4
GOWN STRL REUS W/ TWL LRG LVL3 (GOWN DISPOSABLE) ×1 IMPLANT
GOWN STRL REUS W/ TWL XL LVL3 (GOWN DISPOSABLE) ×2 IMPLANT
GOWN STRL REUS W/TWL LRG LVL3 (GOWN DISPOSABLE) ×2
GOWN STRL REUS W/TWL XL LVL3 (GOWN DISPOSABLE) ×4
IV NS IRRIG 3000ML ARTHROMATIC (IV SOLUTION) ×6 IMPLANT
KIT PERC INSERT 3.0 KNTLS (KITS) ×2 IMPLANT
KIT PUSHLOCK 2.9 HIP (KITS) IMPLANT
KIT SHOULDER TRACTION (DRAPES) ×2 IMPLANT
KIT STR SPEAR 1.8 FBRTK DISP (KITS) ×2 IMPLANT
LASSO 90 CVE QUICKPAS (DISPOSABLE) ×4 IMPLANT
MANIFOLD NEPTUNE II (INSTRUMENTS) ×2 IMPLANT
PACK ARTHROSCOPY DSU (CUSTOM PROCEDURE TRAY) ×2 IMPLANT
PACK BASIN DAY SURGERY FS (CUSTOM PROCEDURE TRAY) ×2 IMPLANT
PAD COLD SHLDR WRAP-ON (PAD) ×2 IMPLANT
SHEET MEDIUM DRAPE 40X70 STRL (DRAPES) ×2 IMPLANT
SLEEVE SCD COMPRESS KNEE MED (STOCKING) ×2 IMPLANT
SLING ARM FOAM STRAP LRG (SOFTGOODS) IMPLANT
SLING ARM IMMOBILIZER LRG (SOFTGOODS) ×2 IMPLANT
STRIP CLOSURE SKIN 1/2X4 (GAUZE/BANDAGES/DRESSINGS) ×2 IMPLANT
SUT FIBERWIRE #2 38 T-5 BLUE (SUTURE)
SUT MNCRL AB 4-0 PS2 18 (SUTURE) ×2 IMPLANT
SUT PDS AB 1 CT  36 (SUTURE)
SUT PDS AB 1 CT 36 (SUTURE) IMPLANT
SUT TIGER TAPE 7 IN WHITE (SUTURE) IMPLANT
SUT VIC AB 3-0 SH 27 (SUTURE)
SUT VIC AB 3-0 SH 27X BRD (SUTURE) IMPLANT
SUTURE FIBERWR #2 38 T-5 BLUE (SUTURE) IMPLANT
TAPE FIBER 2MM 7IN #2 BLUE (SUTURE) IMPLANT
TAPE LABRALWHITE 1.5X36 (TAPE) IMPLANT
TAPE SUT LABRALTAP WHT/BLK (SUTURE) IMPLANT
TOWEL GREEN STERILE FF (TOWEL DISPOSABLE) ×2 IMPLANT
TUBING ARTHROSCOPY IRRIG 16FT (MISCELLANEOUS) ×2 IMPLANT
WATER STERILE IRR 1000ML POUR (IV SOLUTION) ×2 IMPLANT
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
## 2020-12-23 NOTE — Transfer of Care (Signed)
Immediate Anesthesia Transfer of Care Note  Patient: Angela Stevenson  Procedure(s) Performed: ARTHROSCOPIC BANKART REPAIR (Right: Hip)  Patient Location: PACU  Anesthesia Type:GA combined with regional for post-op pain  Level of Consciousness: drowsy  Airway & Oxygen Therapy: Patient Spontanous Breathing and Patient connected to face mask oxygen  Post-op Assessment: Report given to RN and Post -op Vital signs reviewed and stable  Post vital signs: Reviewed and stable  Last Vitals:  Vitals Value Taken Time  BP 138/88 12/23/20 1539  Temp    Pulse 86 12/23/20 1541  Resp 16 12/23/20 1541  SpO2 98 % 12/23/20 1541  Vitals shown include unvalidated device data.  Last Pain:  Vitals:   12/23/20 1152  TempSrc: Oral  PainSc: 0-No pain         Complications: No notable events documented.

## 2020-12-23 NOTE — Progress Notes (Signed)
Assisted Dr. Finucane with right, ultrasound guided, interscalene  block. Side rails up, monitors on throughout procedure. See vital signs in flow sheet. Tolerated Procedure well. ?

## 2020-12-23 NOTE — Discharge Instructions (Addendum)
Diet: As you were doing prior to hospitalization   Shower:  May shower but keep the wounds dry, use an occlusive plastic wrap, NO SOAKING IN TUB.  If the bandage gets wet, change with a clean dry gauze.  If you have a splint on, leave the splint in place and keep the splint dry with a plastic bag.  Dressing:  You may change your dressing 3-5 days after surgery, unless you have a splint.  If you have a splint, then just leave the splint in place and we will change your bandages during your first follow-up appointment.    If you had hand or foot surgery, we will plan to remove your stitches in about 2 weeks in the office.  For all other surgeries, there are sticky tapes (steri-strips) on your wounds and all the stitches are absorbable.  Leave the steri-strips in place when changing your dressings, they will peel off with time, usually 2-3 weeks.  Activity:  Increase activity slowly as tolerated, but follow the weight bearing instructions below.  The rules on driving is that you can not be taking narcotics while you drive, and you must feel in control of the vehicle.    Weight Bearing:   No weight bearing. Keep sling on at all times.   To prevent constipation: you may use a stool softener such as -  Colace (over the counter) 100 mg by mouth twice a day  Drink plenty of fluids (prune juice may be helpful) and high fiber foods Miralax (over the counter) for constipation as needed.    Itching:  If you experience itching with your medications, try taking only a single pain pill, or even half a pain pill at a time.  You may take up to 10 pain pills per day, and you can also use benadryl over the counter for itching or also to help with sleep.   Precautions:  If you experience chest pain or shortness of breath - call 911 immediately for transfer to the hospital emergency department!!  If you develop a fever greater that 101 F, purulent drainage from wound, increased redness or drainage from wound, or  calf pain -- Call the office at [phone]                                                Follow- Up Appointment:  Please call for an appointment to be seen in 2 weeks [place] - [phone]    May have Tylenol after 6pm tonight, if needed.  Regional Anesthesia Blocks  1. Numbness or the inability to move the "blocked" extremity may last from 3-48 hours after placement. The length of time depends on the medication injected and your individual response to the medication. If the numbness is not going away after 48 hours, call your surgeon.  2. The extremity that is blocked will need to be protected until the numbness is gone and the  Strength has returned. Because you cannot feel it, you will need to take extra care to avoid injury. Because it may be weak, you may have difficulty moving it or using it. You may not know what position it is in without looking at it while the block is in effect.  3. For blocks in the legs and feet, returning to weight bearing and walking needs to be done carefully. You will need to  wait until the numbness is entirely gone and the strength has returned. You should be able to move your leg and foot normally before you try and bear weight or walk. You will need someone to be with you when you first try to ensure you do not fall and possibly risk injury.  4. Bruising and tenderness at the needle site are common side effects and will resolve in a few days.  5. Persistent numbness or new problems with movement should be communicated to the surgeon or the [facility] [phone] [facility] [phone]). Information for Discharge Teaching: EXPAREL (bupivacaine liposome injectable suspension)   Your surgeon or anesthesiologist gave you EXPAREL(bupivacaine) to help control your pain after surgery.  EXPAREL is a local anesthetic that provides pain relief by numbing the tissue around the surgical site. EXPAREL is designed to release pain  medication over time and can control pain for up to 72 hours. Depending on how you respond to EXPAREL, you may require less pain medication during your recovery.  Possible side effects: Temporary loss of sensation or ability to move in the area where bupivacaine was injected. Nausea, vomiting, constipation Rarely, numbness and tingling in your mouth or lips, lightheadedness, or anxiety may occur. Call your doctor right away if you think you may be experiencing any of these sensations, or if you have other questions regarding possible side effects.  Follow all other discharge instructions given to you by your surgeon or nurse. Eat a healthy diet and drink plenty of water or other fluids.  If you return to the hospital for any reason within 96 hours following the administration of EXPAREL, it is important for health care providers to know that you have received this anesthetic. A teal colored band has been placed on your arm with the date, time and amount of EXPAREL you have received in order to alert and inform your health care providers. Please leave this armband in place for the full 96 hours following administration, and then you may remove the band.  Post Anesthesia Home Care Instructions  Activity: Get plenty of rest for the remainder of the day. A responsible individual must stay with you for 24 hours following the procedure.  For the next 24 hours, DO NOT: -Drive a car -Advertising copywriter -Drink alcoholic beverages -Take any medication unless instructed by your physician -Make any legal decisions or sign important papers.  Meals: Start with liquid foods such as gelatin or soup. Progress to regular foods as tolerated. Avoid greasy, spicy, heavy foods. If nausea and/or vomiting occur, drink only clear liquids until the nausea and/or vomiting subsides. Call your physician if vomiting continues.  Special Instructions/Symptoms: Your throat may feel dry or sore from the anesthesia or the  breathing tube placed in your throat during surgery. If this causes discomfort, gargle with warm salt water. The discomfort should disappear within 24 hours.  If you had a scopolamine patch placed behind your ear for the management of post- operative nausea and/or vomiting:  1. The medication in the patch is effective for 72 hours, after which it should be removed.  Wrap patch in a tissue and discard in the trash. Wash hands thoroughly with soap and water. 2. You may remove the patch earlier than 72 hours if you experience unpleasant side effects which may include dry mouth, dizziness or visual disturbances. 3. Avoid touching the patch. Wash your hands with soap and water after contact with the patch.

## 2020-12-23 NOTE — Op Note (Signed)
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  ANESTHESIA:   General with regional block  ESTIMATED BLOOD LOSS: Minimal  PREOPERATIVE INDICATIONS:  Angela Stevenson is a  20 y.o. 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    The risks benefits and alternatives were discussed with the patient preoperatively including but not limited to the risks of infection, bleeding, nerve injury, cardiopulmonary complications, the need for revision surgery, among others, and the patient was willing to proceed.  OPERATIVE IMPLANTS: Arthrex bio 1.8 mm fiber tack x4, 3 for the anterior glenoid and 1 for the posterior glenoid  OPERATIVE FINDINGS: She had an element of multidirectional instability with substantial capsular labral laxity.  She did have a Buford complex with an anterosuperior labral foramen.  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  The supraspinatus was intact, the superior labrum and biceps and subscapularis were normal.  She had a tear that extended from the 3 o'clock position around below the inferior margin of the glenoid, and then around posteriorly to approximately 8:00.  UNIQUE ASPECTS  OF THE CASE: I debated whether to repair the posterior labrum or not, it was not torn so much superiorly as inferiorly, but I felt that Stevenson her multidirectional instability type configuration that she would benefit from posterior stabilization as well.  OPERATIVE PROCEDURE: The patient was brought to the operating room and placed in the supine position. General anesthesia was administered. IV antibiotics were Stevenson. General anesthesia was administered.   The upper extremity was examined and found to be grossly unstable particularly to anterior testing.  She also had a little bit of crepitance with a slight clunk with posterior testing.  The upper extremity was prepped and draped in the usual sterile fashion. The patient was in a semilateral decubitus position.  Time out was performed. Diagnostic arthroscopy was carried out the above-named findings.   I placed 2 anterior cannulae, and then mobilized the labrum off of the medial neck of the glenoid with the spatula.  I then prepared the neck of the glenoid with a shaver/rasp to optimize healing, while still preserving the anterior bone stock.  The labrum had excellent mobility.  I switched portals to view from anterosuperior.  I placed a fiber tack anchor into the 5:30 position, and imbricated the capsular labral structures with excellent soft tissue purchase.  This was tensioned, and I preserved the suture for the remainder of the case.  I then shuttled suture, placed another anchor at the 3:30 position, and secured that through another passed through the capsule and tissue.  I then placed a third anchor at the 2:30 position, augmenting the inferior repair.  I tensioned all the sutures once more, and then cut them.  I debated about the posterior repair, but felt that she would benefit from additional stability.  A percutaneous cannula was placed posterosuperiorly, and I drilled and placed a posterior anchor with excellent fixation.  I then used the  curved suture passer to grab posterior tissue coming up at the glenoid labral junction, and then secured the posterior tissue as well.  Excellent reconstruction was achieved.  The portals were closed with Monocryl followed by Steri-Strips and sterile gauze.  She was awakened and returned to the PACU in stable and satisfactory condition.  There were no complications and she tolerated the procedure well.

## 2020-12-23 NOTE — Anesthesia Procedure Notes (Signed)
Procedure Name: Intubation Date/Time: 12/23/2020 1:31 PM Performed by: Cleda Clarks, CRNA Pre-anesthesia Checklist: Patient identified, Emergency Drugs available, Suction available and Patient being monitored Patient Re-evaluated:Patient Re-evaluated prior to induction Oxygen Delivery Method: Circle system utilized Preoxygenation: Pre-oxygenation with 100% oxygen Induction Type: IV induction Ventilation: Mask ventilation without difficulty Laryngoscope Size: Miller and 2 Grade View: Grade II Tube type: Oral Tube size: 7.0 mm Number of attempts: 1 Airway Equipment and Method: Stylet and Oral airway Placement Confirmation: ETT inserted through vocal cords under direct vision, positive ETCO2 and breath sounds checked- equal and bilateral Secured at: 21 cm Tube secured with: Tape Dental Injury: Teeth and Oropharynx as per pre-operative assessment

## 2020-12-23 NOTE — Anesthesia Procedure Notes (Signed)
Anesthesia Regional Block: Interscalene brachial plexus block   Pre-Anesthetic Checklist: , timeout performed,  Correct Patient, Correct Site, Correct Laterality,  Correct Procedure, Correct Position, site marked,  Risks and benefits discussed,  Surgical consent,  Pre-op evaluation,  At surgeon's request and post-op pain management  Laterality: Right  Prep: Maximum Sterile Barrier Precautions used, chloraprep       Needles:  Injection technique: Single-shot  Needle Type: Echogenic Stimulator Needle     Needle Length: 9cm  Needle Gauge: 22     Additional Needles:   Procedures:,,,, ultrasound used (permanent image in chart),,    Narrative:  Start time: 12/23/2020 12:50 PM End time: 12/23/2020 12:55 PM Injection made incrementally with aspirations every 5 mL.  Performed by: Personally  Anesthesiologist: Lannie Fields, DO  Additional Notes: Monitors applied. No increased pain on injection. No increased resistance to injection. Injection made in 5cc increments. Good needle visualization. Patient tolerated procedure well.

## 2020-12-23 NOTE — Anesthesia Postprocedure Evaluation (Signed)
Anesthesia Post Note  Patient: TEAGHAN MELROSE  Procedure(s) Performed: ARTHROSCOPIC BANKART REPAIR (Right: Hip)     Patient location during evaluation: PACU Anesthesia Type: Regional and General Level of consciousness: awake and alert, oriented and patient cooperative Pain management: pain level controlled Vital Signs Assessment: post-procedure vital signs reviewed and stable Respiratory status: spontaneous breathing, nonlabored ventilation and respiratory function stable Cardiovascular status: blood pressure returned to baseline and stable Postop Assessment: no apparent nausea or vomiting Anesthetic complications: no   No notable events documented.  Last Vitals:  Vitals:   12/23/20 1545 12/23/20 1600  BP: 130/82 127/85  Pulse: 84 70  Resp: 14 14  Temp: (!) 36.4 C   SpO2: 97% 96%    Last Pain:  Vitals:   12/23/20 1600  TempSrc:   PainSc: Asleep                 Lannie Fields

## 2020-12-23 NOTE — Interval H&P Note (Signed)
The risks benefits and alternatives were discussed with the patient including but not limited to the risks of nonoperative treatment, versus surgical intervention including infection, bleeding, nerve injury, recurrent instability the need for revision surgery, hardware prominence, hardware failure, the need for hardware removal, blood clots, cardiopulmonary complications, morbidity, mortality, among others, and they were willing to proceed.    Plan for right shoulder scope with bankart repair.  Eulas Post, MD

## 2020-12-25 ENCOUNTER — Encounter (HOSPITAL_BASED_OUTPATIENT_CLINIC_OR_DEPARTMENT_OTHER): Payer: Self-pay | Admitting: Orthopedic Surgery

## 2021-01-05 DIAGNOSIS — M25311 Other instability, right shoulder: Secondary | ICD-10-CM | POA: Diagnosis not present

## 2021-01-27 ENCOUNTER — Ambulatory Visit (INDEPENDENT_AMBULATORY_CARE_PROVIDER_SITE_OTHER): Payer: 59 | Admitting: Obstetrics & Gynecology

## 2021-01-27 ENCOUNTER — Encounter: Payer: Self-pay | Admitting: Obstetrics & Gynecology

## 2021-01-27 ENCOUNTER — Other Ambulatory Visit (HOSPITAL_COMMUNITY)
Admission: RE | Admit: 2021-01-27 | Discharge: 2021-01-27 | Disposition: A | Discharge status: Home / Self Care | Payer: 59 | Source: Ambulatory Visit | Attending: Obstetrics & Gynecology | Admitting: Obstetrics & Gynecology

## 2021-01-27 ENCOUNTER — Other Ambulatory Visit: Payer: Self-pay

## 2021-01-27 VITALS — BP 100/64 | HR 93 | Resp 16 | Ht 67.75 in | Wt 146.0 lb

## 2021-01-27 DIAGNOSIS — Z113 Encounter for screening for infections with a predominantly sexual mode of transmission: Secondary | ICD-10-CM

## 2021-01-27 DIAGNOSIS — R87610 Atypical squamous cells of undetermined significance on cytologic smear of cervix (ASC-US): Secondary | ICD-10-CM | POA: Diagnosis not present

## 2021-01-27 DIAGNOSIS — R87810 Cervical high risk human papillomavirus (HPV) DNA test positive: Secondary | ICD-10-CM | POA: Diagnosis not present

## 2021-01-27 DIAGNOSIS — Z01419 Encounter for gynecological examination (general) (routine) without abnormal findings: Secondary | ICD-10-CM | POA: Diagnosis not present

## 2021-01-27 DIAGNOSIS — N3090 Cystitis, unspecified without hematuria: Secondary | ICD-10-CM

## 2021-01-27 DIAGNOSIS — Z789 Other specified health status: Secondary | ICD-10-CM

## 2021-01-27 MED ORDER — NITROFURANTOIN MONOHYD MACRO 100 MG PO CAPS: 100.0000 mg | ORAL_CAPSULE | Freq: Every day | ORAL | 3 refills | Status: DC | PRN

## 2021-01-27 NOTE — Addendum Note (Signed)
Addended by: [name] on: 01/27/2021 04:19 PM   Modules accepted: Orders

## 2021-01-27 NOTE — Progress Notes (Addendum)
Angela Stevenson 2001/01/18 [id number]   History:    21 y.o. . G0 Single.  Boyfriend x almost 2 years.  Works as a Engineer, civil (consulting) at [employer].   RP:  Established patient presenting for annual gyn exam    HPI:  Menses regular normal.  No BTB.  No dysmenorrhea.  No pelvic pain.  No abnormal vaginal d/c.  No pain with IC.  Frequent UTIs post coitally.  Last Pap 01/2020 Neg/HPV HR Neg.  H/O ASCUS/HPV HR Pos. Using condoms.  Breasts wnl.  Mictions/BMs wnl.  BMI 22.36.     Past medical history,surgical history, family history and social history were all reviewed and documented in the EPIC chart.  Gynecologic History Patient's last menstrual period was 01/15/2021 (exact date).  Obstetric History OB History  Gravida Para Term Preterm AB Living  0 0 0 0 0 0  SAB IAB Ectopic Multiple Live Births  0 0 0 0 0     ROS: A ROS was performed and pertinent positives and negatives are included in the history.  GENERAL: No fevers or chills. HEENT: No change in vision, no earache, sore throat or sinus congestion. NECK: No pain or stiffness. CARDIOVASCULAR: No chest pain or pressure. No palpitations. PULMONARY: No shortness of breath, cough or wheeze. GASTROINTESTINAL: No abdominal pain, nausea, vomiting or diarrhea, melena or bright red blood per rectum. GENITOURINARY: No urinary frequency, urgency, hesitancy or dysuria. MUSCULOSKELETAL: No joint or muscle pain, no back pain, no recent trauma. DERMATOLOGIC: No rash, no itching, no lesions. ENDOCRINE: No polyuria, polydipsia, no heat or cold intolerance. No recent change in weight. HEMATOLOGICAL: No anemia or easy bruising or bleeding. NEUROLOGIC: No headache, seizures, numbness, tingling or weakness. PSYCHIATRIC: No depression, no loss of interest in normal activity or change in sleep pattern.     Exam:   BP 100/64    Pulse 93    Resp 16    Ht 5' 7.75" (1.721 m)    Wt 146 lb (66.2 kg)    LMP 01/15/2021 (Exact Date)    BMI 22.36 kg/m   Body mass index is 22.36  kg/m.  General appearance : Well developed well nourished female. No acute distress HEENT: Eyes: no retinal hemorrhage or exudates,  Neck supple, trachea midline, no carotid bruits, no thyroidmegaly Lungs: Clear to auscultation, no rhonchi or wheezes, or rib retractions  Heart: Regular rate and rhythm, no murmurs or gallops Breast:Examined in sitting and supine position were symmetrical in appearance, no palpable masses or tenderness,  no skin retraction, no nipple inversion, no nipple discharge, no skin discoloration, no axillary or supraclavicular lymphadenopathy Abdomen: no palpable masses or tenderness, no rebound or guarding Extremities: no edema or skin discoloration or tenderness  Pelvic: Vulva: Normal             Vagina: No gross lesions or discharge  Cervix: No gross lesions or discharge.  Pap reflex, Gono-Chlam.  Uterus  AV, normal size, shape and consistency, non-tender and mobile  Adnexa  Without masses or tenderness  Anus: Normal   Assessment/Plan:  21 y.o. female for annual exam   1. Encounter for routine gynecological examination with Papanicolaou smear of cervix Menses regular normal.  No BTB.  No dysmenorrhea.  No pelvic pain.  No abnormal vaginal d/c.  No pain with IC.  Frequent UTIs post coitally.  Last Pap 01/2020 Neg/HPV HR Neg.  H/O ASCUS/HPV HR Pos. Using condoms.  Breasts wnl.  Mictions/BMs wnl.  BMI 22.36.  - Cytology - PAP(  [place])  2. ASCUS with positive high risk HPV cervical - Cytology - PAP( [place])  3. Use of condoms for contraception  4. Screen for STD (sexually transmitted disease)  Gono-Chlam on Pap.  5. Recurrent cystitis Post coital Cystitis.  Counseling done.  MacroBID 1 tab prior to intercourse as needed.  Usage reviewed.  Prescription sent to pharmacy.  Other orders - nitrofurantoin, macrocrystal-monohydrate, (MACROBID) 100 MG capsule; Take 1 capsule (100 mg total) by mouth daily as needed. Prophylaxis for recurrent post coital  cystitis.  1 tab PO prior to sexual activity as needed.   [name] MD, 3:52 PM 01/27/2021   [l

## 2021-01-29 LAB — CYTOLOGY - PAP
Chlamydia: NEGATIVE
Comment: NEGATIVE
Comment: NORMAL
Diagnosis: NEGATIVE
Neisseria Gonorrhea: NEGATIVE

## 2021-02-02 DIAGNOSIS — M25311 Other instability, right shoulder: Secondary | ICD-10-CM | POA: Diagnosis not present

## 2021-02-05 DIAGNOSIS — M25611 Stiffness of right shoulder, not elsewhere classified: Secondary | ICD-10-CM | POA: Diagnosis not present

## 2021-02-05 DIAGNOSIS — M24411 Recurrent dislocation, right shoulder: Secondary | ICD-10-CM | POA: Diagnosis not present

## 2021-02-05 DIAGNOSIS — M6281 Muscle weakness (generalized): Secondary | ICD-10-CM | POA: Diagnosis not present

## 2021-02-05 DIAGNOSIS — M25511 Pain in right shoulder: Secondary | ICD-10-CM | POA: Diagnosis not present

## 2021-02-09 DIAGNOSIS — M6281 Muscle weakness (generalized): Secondary | ICD-10-CM | POA: Diagnosis not present

## 2021-02-09 DIAGNOSIS — M25511 Pain in right shoulder: Secondary | ICD-10-CM | POA: Diagnosis not present

## 2021-02-09 DIAGNOSIS — M24411 Recurrent dislocation, right shoulder: Secondary | ICD-10-CM | POA: Diagnosis not present

## 2021-02-09 DIAGNOSIS — M25611 Stiffness of right shoulder, not elsewhere classified: Secondary | ICD-10-CM | POA: Diagnosis not present

## 2021-02-11 DIAGNOSIS — M25611 Stiffness of right shoulder, not elsewhere classified: Secondary | ICD-10-CM | POA: Diagnosis not present

## 2021-02-11 DIAGNOSIS — M6281 Muscle weakness (generalized): Secondary | ICD-10-CM | POA: Diagnosis not present

## 2021-02-11 DIAGNOSIS — M25511 Pain in right shoulder: Secondary | ICD-10-CM | POA: Diagnosis not present

## 2021-02-11 DIAGNOSIS — M24411 Recurrent dislocation, right shoulder: Secondary | ICD-10-CM | POA: Diagnosis not present

## 2021-02-16 DIAGNOSIS — M25511 Pain in right shoulder: Secondary | ICD-10-CM | POA: Diagnosis not present

## 2021-02-16 DIAGNOSIS — M25611 Stiffness of right shoulder, not elsewhere classified: Secondary | ICD-10-CM | POA: Diagnosis not present

## 2021-02-16 DIAGNOSIS — M24411 Recurrent dislocation, right shoulder: Secondary | ICD-10-CM | POA: Diagnosis not present

## 2021-02-16 DIAGNOSIS — M6281 Muscle weakness (generalized): Secondary | ICD-10-CM | POA: Diagnosis not present

## 2021-02-19 DIAGNOSIS — M25611 Stiffness of right shoulder, not elsewhere classified: Secondary | ICD-10-CM | POA: Diagnosis not present

## 2021-02-19 DIAGNOSIS — M6281 Muscle weakness (generalized): Secondary | ICD-10-CM | POA: Diagnosis not present

## 2021-02-19 DIAGNOSIS — M25511 Pain in right shoulder: Secondary | ICD-10-CM | POA: Diagnosis not present

## 2021-02-19 DIAGNOSIS — M24411 Recurrent dislocation, right shoulder: Secondary | ICD-10-CM | POA: Diagnosis not present

## 2021-02-23 DIAGNOSIS — M6281 Muscle weakness (generalized): Secondary | ICD-10-CM | POA: Diagnosis not present

## 2021-02-23 DIAGNOSIS — M24411 Recurrent dislocation, right shoulder: Secondary | ICD-10-CM | POA: Diagnosis not present

## 2021-02-23 DIAGNOSIS — M25611 Stiffness of right shoulder, not elsewhere classified: Secondary | ICD-10-CM | POA: Diagnosis not present

## 2021-02-23 DIAGNOSIS — M25511 Pain in right shoulder: Secondary | ICD-10-CM | POA: Diagnosis not present

## 2021-03-02 DIAGNOSIS — M24411 Recurrent dislocation, right shoulder: Secondary | ICD-10-CM | POA: Diagnosis not present

## 2021-03-04 DIAGNOSIS — M25511 Pain in right shoulder: Secondary | ICD-10-CM | POA: Diagnosis not present

## 2021-03-04 DIAGNOSIS — M6281 Muscle weakness (generalized): Secondary | ICD-10-CM | POA: Diagnosis not present

## 2021-03-04 DIAGNOSIS — M25611 Stiffness of right shoulder, not elsewhere classified: Secondary | ICD-10-CM | POA: Diagnosis not present

## 2021-03-04 DIAGNOSIS — M24411 Recurrent dislocation, right shoulder: Secondary | ICD-10-CM | POA: Diagnosis not present

## 2021-03-05 NOTE — Telephone Encounter (Signed)
Erroneous encounter. Please disregard.

## 2021-03-12 DIAGNOSIS — M24411 Recurrent dislocation, right shoulder: Secondary | ICD-10-CM | POA: Diagnosis not present

## 2021-03-25 DIAGNOSIS — M24411 Recurrent dislocation, right shoulder: Secondary | ICD-10-CM | POA: Diagnosis not present

## 2021-03-30 DIAGNOSIS — M24411 Recurrent dislocation, right shoulder: Secondary | ICD-10-CM | POA: Diagnosis not present

## 2021-03-31 ENCOUNTER — Encounter (HOSPITAL_COMMUNITY): Payer: Self-pay | Admitting: Radiology

## 2021-03-31 ENCOUNTER — Ambulatory Visit (HOSPITAL_COMMUNITY)
Admission: EM | Admit: 2021-03-31 | Discharge: 2021-03-31 | Disposition: A | Discharge status: Home / Self Care | Payer: 59 | Attending: Student | Admitting: Student

## 2021-03-31 ENCOUNTER — Encounter (HOSPITAL_COMMUNITY): Payer: Self-pay

## 2021-03-31 ENCOUNTER — Other Ambulatory Visit: Payer: Self-pay

## 2021-03-31 ENCOUNTER — Emergency Department (HOSPITAL_COMMUNITY): Payer: 59

## 2021-03-31 ENCOUNTER — Emergency Department (HOSPITAL_COMMUNITY)
Admission: EM | Admit: 2021-03-31 | Discharge: 2021-03-31 | Disposition: A | Discharge status: Home / Self Care | Payer: 59 | Attending: Emergency Medicine | Admitting: Emergency Medicine

## 2021-03-31 DIAGNOSIS — K0889 Other specified disorders of teeth and supporting structures: Secondary | ICD-10-CM | POA: Diagnosis present

## 2021-03-31 DIAGNOSIS — K047 Periapical abscess without sinus: Secondary | ICD-10-CM | POA: Diagnosis not present

## 2021-03-31 DIAGNOSIS — R6884 Jaw pain: Secondary | ICD-10-CM | POA: Diagnosis not present

## 2021-03-31 DIAGNOSIS — K029 Dental caries, unspecified: Secondary | ICD-10-CM | POA: Insufficient documentation

## 2021-03-31 DIAGNOSIS — R220 Localized swelling, mass and lump, head: Secondary | ICD-10-CM | POA: Diagnosis not present

## 2021-03-31 DIAGNOSIS — Z8719 Personal history of other diseases of the digestive system: Secondary | ICD-10-CM | POA: Diagnosis not present

## 2021-03-31 DIAGNOSIS — R221 Localized swelling, mass and lump, neck: Secondary | ICD-10-CM | POA: Diagnosis not present

## 2021-03-31 MED ORDER — IOHEXOL 300 MG/ML  SOLN
75.0000 mL | Freq: Once | INTRAMUSCULAR | Status: AC | PRN
  Administered 2021-03-31: 75 mL via INTRAVENOUS

## 2021-03-31 MED ORDER — KETOROLAC TROMETHAMINE 30 MG/ML IJ SOLN
INTRAMUSCULAR | Status: AC
  Filled 2021-03-31: qty 1

## 2021-03-31 MED ORDER — KETOROLAC TROMETHAMINE 30 MG/ML IJ SOLN
30.0000 mg | Freq: Once | INTRAMUSCULAR | Status: AC
  Administered 2021-03-31: 30 mg via INTRAMUSCULAR

## 2021-03-31 MED ORDER — CELECOXIB 200 MG PO CAPS: 200.0000 mg | ORAL_CAPSULE | Freq: Two times a day (BID) | ORAL | 0 refills | Status: DC

## 2021-03-31 MED ORDER — AMOXICILLIN 500 MG PO CAPS: 500.0000 mg | ORAL_CAPSULE | Freq: Three times a day (TID) | ORAL | 0 refills | Status: DC

## 2021-03-31 NOTE — ED Triage Notes (Signed)
Pt. Stated, I have dental pain and its a repeat from a previous infection. I was given an antibiotic and I didn't have the insurance. Its back again. [facility] to [facility] and they sent me down here for evaluation. ?

## 2021-03-31 NOTE — Discharge Instructions (Signed)

## 2021-03-31 NOTE — Discharge Instructions (Addendum)
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 This must be evaluated in the ED with possible IV antibiotics.  ?-Try to establish care with a dentist  ?

## 2021-03-31 NOTE — ED Triage Notes (Signed)
Onset yesterday upon waking of left sided bottom tooth pain with facial swelling and jaw pain. ?Has been gargling with peroxide with minimal relief. Notes some relief w/cold compress and oxycodone that she had left over from a previous surgery. Reports pain/chewing and opening her mouth wide. ?

## 2021-03-31 NOTE — ED Provider Triage Note (Addendum)
Emergency Medicine Provider Triage Evaluation Note ? ?Angela Stevenson , a 21 y.o. female  was evaluated in triage.  Pt complains of left sided tooth and jaw pain. She originally presented to urgent care but the provider was concerned for Ludwig's angina and sent her to the ER for evaluation. She does not have a dentist. She states an ice pack and oxycodone provided relief at home.  ? ?Review of Systems  ?Positive: Left lower jaw and tooth pain, facial swelling, pain with talking ?Negative: Pain under tongue, sore throat, trouble swallowing, fevers ? ?Physical Exam  ?BP 120/79 (BP Location: Left Arm)   Pulse 99   Temp 98.5 ?F (36.9 ?C) (Oral)   Resp 17   SpO2 100%  ?Gen:   Awake, no distress   ?Resp:  Normal effort  ?MSK:   Moves extremities without difficulty  ?Other:  Mild trismus, no sublingual swelling, some tenderness to palpation and edema of left submandibular space, full ROM of neck, no oropharyngeal erythema or exudate, dental cary of left molar without obvious abscess ? ?Medical Decision Making  ?Medically screening exam initiated at 10:23 AM.  Appropriate orders placed.  Angela Stevenson was informed that the remainder of the evaluation will be completed by another provider, this initial triage assessment does not replace that evaluation, and the importance of remaining in the ED until their evaluation is complete. ? ?Pt was Stevenson toradol at urgent care and said she had some relief. Will hold off on further medication right now. As patient has had history of deeper space infection, will obtain CT soft tissue. I have low suspicion for Ludwig's at this time.  ? ?  ?Su Monks, PA-C ?03/31/21 1040 ? ?

## 2021-03-31 NOTE — ED Provider Notes (Signed)
?[facility] ? ? ? ?CSN: [id number] ?Arrival date & time: 03/31/21  [id number] ? ? ?  ? ?History   ?Chief Complaint ?Chief Complaint  ?Patient presents with  ? Dental Pain  ?  Left bottom  ? ? ?HPI ?Angela Stevenson is a 21 y.o. female presenting with left sided tooth and jaw pain.  History of similar in the past, has been evaluated in the emergency department for this.  Describes sudden onset of left lower jaw and tooth pain, with facial swelling and pain with talking.  Denies pain under the tongue, but does endorse significant pain under her jaw.  Denies foul taste in mouth, sore throat, trouble swallowing, fever/chills.  Has not established care with a dentist.  States an ice pack and oxycodone provided relief at home. ? ?HPI ? ?Past Medical History:  ?Diagnosis Date  ? Asthma   ? Asthma 2013  ? Patient reported  ? RA (rheumatoid arthritis) (HCC)   ? ? ?Patient Active Problem List  ? Diagnosis Date Noted  ? History of rheumatoid arthritis 10/31/2018  ? Dizziness and giddiness 05/09/2018  ? ? ?Past Surgical History:  ?Procedure Laterality Date  ? BANKART REPAIR Right 12/23/2020  ? Procedure: ARTHROSCOPIC BANKART REPAIR;  Surgeon: Teryl LucyLandau, Joshua, MD;  Location: [facility];  Service: Orthopedics;  Laterality: Right;  ? ? ?OB History   ? ? Gravida  ?0  ? Para  ?0  ? Term  ?0  ? Preterm  ?0  ? AB  ?0  ? Living  ?0  ?  ? ? SAB  ?0  ? IAB  ?0  ? Ectopic  ?0  ? Multiple  ?0  ? Live Births  ?0  ?   ?  ?  ? ? ? ?Home Medications   ? ?Prior to Admission medications   ?Medication Sig Start Date End Date Taking? Authorizing Provider  ?albuterol (VENTOLIN HFA) 108 (90 Base) MCG/ACT inhaler Inhale 2 puffs into the lungs every 6 (six) hours as needed for wheezing or shortness of breath. 02/20/20   Just, Azalee CourseKelsea J, FNP  ? ? ?Family History ?Family History  ?Problem Relation Age of Onset  ? Hypertension Father   ? Cancer Maternal Grandmother   ?     lung   ? Cancer Paternal Grandmother   ? Cancer Paternal Grandfather    ? ? ?Social History ?Social History  ? ?Tobacco Use  ? Smoking status: Never  ? Smokeless tobacco: Never  ?Vaping Use  ? Vaping Use: Never used  ?Substance Use Topics  ? Alcohol use: Yes  ?  Comment: occasionally  ? Drug use: No  ? ? ? ?Allergies   ?Celery oil and Mushroom extract complex ? ? ?Review of Systems ?Review of Systems  ?HENT:  Positive for dental problem.   ?All other systems reviewed and are negative. ? ? ?Physical Exam ?Triage Vital Signs ?ED Triage Vitals  ?Enc Vitals Group  ?   BP 03/31/21 0910 123/81  ?   Pulse Rate 03/31/21 0910 (!) 108  ?   Resp 03/31/21 0910 18  ?   Temp 03/31/21 0910 98.3 ?F (36.8 ?C)  ?   Temp Source 03/31/21 0910 Oral  ?   SpO2 03/31/21 0910 97 %  ?   Weight --   ?   Height --   ?   Head Circumference --   ?   Peak Flow --   ?   Pain Score 03/31/21 0908 8  ?  Pain Loc --   ?   Pain Edu? --   ?   Excl. in GC? --   ? ?No data found. ? ?Updated Vital Signs ?BP 123/81 (BP Location: Left Arm)   Pulse (!) 108   Temp 98.3 ?F (36.8 ?C) (Oral)   Resp 18   SpO2 97%  ? ?Visual Acuity ?Right Eye Distance:   ?Left Eye Distance:   ?Bilateral Distance:   ? ?Right Eye Near:   ?Left Eye Near:    ?Bilateral Near:    ? ?Physical Exam ?Vitals reviewed.  ?Constitutional:   ?   General: She is not in acute distress. ?   Appearance: Normal appearance. She is not ill-appearing, toxic-appearing or diaphoretic.  ?HENT:  ?   Head: Normocephalic and atraumatic.  ?   Jaw: There is normal jaw occlusion. No trismus, tenderness, swelling, pain on movement or malocclusion.  ?   Salivary Glands: Right salivary gland is not diffusely enlarged or tender. Left salivary gland is not diffusely enlarged or tender.  ?   Right Ear: Hearing normal.  ?   Left Ear: Hearing normal.  ?   Nose: Nose normal.  ?   Mouth/Throat:  ?   Lips: Pink.  ?   Mouth: Mucous membranes are moist. No lacerations or oral lesions.  ?   Dentition: Abnormal dentition. Does not have dentures. Dental tenderness, gingival swelling and  dental caries present.  ?   Tongue: No lesions. Tongue does not deviate from midline.  ?   Palate: No mass.  ?   Pharynx: Oropharynx is clear. Uvula midline. No oropharyngeal exudate or posterior oropharyngeal erythema.  ?   Tonsils: No tonsillar exudate or tonsillar abscesses.  ?   Comments: Poor dentician  ?L face and jaw is visibly swollen, and exquisitely tender to palpation over the cheek and lower jaw. Muffled voice and trismus. Unable to visualize her pharynx. No obvious tongue swelling, no pain under the tongue. There is gingival swelling surrounding the L lower outer molars.  ?Eyes:  ?   Extraocular Movements: Extraocular movements intact.  ?   Pupils: Pupils are equal, round, and reactive to light.  ?Pulmonary:  ?   Effort: Pulmonary effort is normal.  ?Neurological:  ?   General: No focal deficit present.  ?   Mental Status: She is alert and oriented to person, place, and time.  ?Psychiatric:     ?   Mood and Affect: Mood normal.     ?   Behavior: Behavior normal.     ?   Thought Content: Thought content normal.     ?   Judgment: Judgment normal.  ? ? ? ?UC Treatments / Results  ?Labs ?(all labs ordered are listed, but only abnormal results are displayed) ?Labs Reviewed - No data to display ? ?EKG ? ? ?Radiology ?No results found. ? ?Procedures ?Procedures (including critical care time) ? ?Medications Ordered in UC ?Medications  ?ketorolac (TORADOL) 30 MG/ML injection 30 mg (30 mg Intramuscular Given 03/31/21 0951)  ? ? ?Initial Impression / Assessment and Plan / UC Course  ?I have reviewed the triage vital signs and the nursing notes. ? ?Pertinent labs & imaging results that were available during my care of the patient were reviewed by me and considered in my medical decision making (see chart for details). ? ?  ? ?This patient is a very pleasant 21 y.o. year old female presenting with dental infection. Afebrile but tachy. 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 She unfortunately does not have a dentist. Sent to ED via personal vehicle, she is in agreement. Toradol IM administered for comfort, States she is not pregnant or breastfeeding. ?.  ? ?Final Clinical Impressions(s) / UC Diagnoses  ? ?Final diagnoses:  ?Dental infection  ? ? ? ?Discharge Instructions   ? ?  ?-I am concerned you have a severe dental infection called Ludwig's angina. This must be evaluated in the ED with possible IV antibiotics.  ?-Try to establish care with a dentist  ? ? ? ? ?ED Prescriptions   ?None ?  ? ?PDMP not reviewed this encounter. ?  ?Rhys Martini, PA-C ?03/31/21 7829 ? ?

## 2021-03-31 NOTE — ED Provider Notes (Signed)
?[facility] EMERGENCY DEPARTMENT ?Provider Note ? ? ?CSN: [id number] ?Arrival date & time: 03/31/21  0957 ? ?  ? ?History ? ?Chief Complaint  ?Patient presents with  ? Dental Pain  ? ? ?Angela Stevenson is a 21 y.o. female who was sent from UC to r/o Ludwigs Angina. Patient has dental decay and noticed pain in her Left lower molar. Patient has had issues with the tooth in the past. She had onset of pain and swelling this morning. ?She has no change in voice, fever, difficulty tolerating secretions or pain with swallowing. ? ? ?Dental Pain ? ?  ? ?Home Medications ?Prior to Admission medications   ?Medication Sig Start Date End Date Taking? Authorizing Provider  ?albuterol (VENTOLIN HFA) 108 (90 Base) MCG/ACT inhaler Inhale 2 puffs into the lungs every 6 (six) hours as needed for wheezing or shortness of breath. 02/20/20   Just, Azalee Course, FNP  ?   ? ?Allergies    ?Celery oil and Mushroom extract complex   ? ?Review of Systems   ?Review of Systems ? ?Physical Exam ?Updated Vital Signs ?BP 120/79 (BP Location: Left Arm)   Pulse 99   Temp 98.5 ?F (36.9 ?C) (Oral)   Resp 17   LMP 03/12/2021   SpO2 100%  ?Physical Exam ?Vitals and nursing note reviewed.  ?Constitutional:   ?   General: She is not in acute distress. ?   Appearance: She is well-developed. She is not diaphoretic.  ?HENT:  ?   Head: Normocephalic and atraumatic.  ?   Right Ear: External ear normal.  ?   Left Ear: External ear normal.  ?   Nose: Nose normal.  ?   Mouth/Throat:  ?   Mouth: Mucous membranes are moist.  ?   Comments: Patient with normal phonation, no trismus. ?Minimal swelling to the left lower mandible, minimal tenderness.  She has obvious decay to the gumline of the second molar on the left lower side, no obvious signs of abscess.  There is no swelling below the angle of the mandible, no throat fullness.  No other obvious signs of Ludwick's angina ?Eyes:  ?   General: No scleral icterus. ?   Conjunctiva/sclera: Conjunctivae  normal.  ?Cardiovascular:  ?   Rate and Rhythm: Normal rate and regular rhythm.  ?   Heart sounds: Normal heart sounds. No murmur heard. ?  No friction rub. No gallop.  ?Pulmonary:  ?   Effort: Pulmonary effort is normal. No respiratory distress.  ?   Breath sounds: Normal breath sounds.  ?Abdominal:  ?   General: Bowel sounds are normal. There is no distension.  ?   Palpations: Abdomen is soft. There is no mass.  ?   Tenderness: There is no abdominal tenderness. There is no guarding.  ?Musculoskeletal:  ?   Cervical back: Normal range of motion.  ?Skin: ?   General: Skin is warm and dry.  ?Neurological:  ?   Mental Status: She is alert and oriented to person, place, and time.  ?Psychiatric:     ?   Behavior: Behavior normal.  ? ? ?ED Results / Procedures / Treatments   ?Labs ?(all labs ordered are listed, but only abnormal results are displayed) ?Labs Reviewed - No data to display ? ?EKG ?None ? ?Radiology ?CT Soft Tissue Neck W Contrast ? ?Result Date: 03/31/2021 ?CLINICAL DATA:  Soft tissue swelling. Left lower tooth pain with facial swelling and jaw pain EXAM: CT NECK WITH CONTRAST TECHNIQUE:  Multidetector CT imaging of the neck was performed using the standard protocol following the bolus administration of intravenous contrast. RADIATION DOSE REDUCTION: This exam was performed according to the departmental dose-optimization program which includes automated exposure control, adjustment of the mA and/or kV according to patient size and/or use of iterative reconstruction technique. CONTRAST:  15mL OMNIPAQUE IOHEXOL 300 MG/ML  SOLN COMPARISON:  None. FINDINGS: Pharynx and larynx: Normal. No mass or swelling. Salivary glands: No inflammation, mass, or stone. Thyroid: Negative Lymph nodes: No pathologic adenopathy in the neck. Right level 2 lymph node 6 mm. Posterior lymph node on the right 6 mm. Left level 2 lymph node 9 mm. Posterior lymph node on the left 6 mm. Vascular: Normal vascular enhancement. Limited  intracranial: Negative Visualized orbits: Negative Mastoids and visualized paranasal sinuses: Mild mucosal edema paranasal sinuses. No air-fluid level. Mastoid clear. Skeleton: No acute skeletal abnormality Caries left lower molar. Slight lucency around the root of this tooth. Upper chest: Lung apices clear bilaterally Other: Mild soft tissue swelling lateral to the mandible on the left. No soft tissue abscess identified. IMPRESSION: Caries left lower molar with mild periapical lucency. Mild associated soft tissue swelling lateral to the mandible on the left. No soft tissue abscess. Electronically Signed   By: Marlan Palau M.D.   On: 03/31/2021 11:25   ? ?Procedures ?Procedures  ? ?Medications Ordered in ED ?Medications  ?iohexol (OMNIPAQUE) 300 MG/ML solution 75 mL (75 mLs Intravenous Contrast Given 03/31/21 1107)  ? ? ?ED Course/ Medical Decision Making/ A&P ?  ?                        ?Medical Decision Making ?Patient with minimal swelling of the left mandible  withobvious dental carry. There is no clinical indication for Ludwig's angina on physical examination.  The patient CT scan is negative for any swelling.  There are some mild periapical lucency consistent with likely developing periapical abscess.  Patient will be given anti-inflammatories, amoxicillin referral to maxillofacial surgery as the patient has decay to the gumline likely cannot be excised by a dentist.  Patient appears otherwise appropriate for discharge at this time. ? ?Problems Addressed: ?Dental caries: chronic illness or injury ?Periapical abscess: self-limited or minor problem ? ?Amount and/or Complexity of Data Reviewed ?Radiology: independent interpretation performed. Decision-making details documented in ED Course. ?   Details: I visualized images. no signs of sever infection or Ludwig's ? ?Risk ?Prescription drug management. ? ?Final Clinical Impression(s) / ED Diagnoses ?Final diagnoses:  ?None  ? ? ?Rx / DC Orders ?ED Discharge Orders    ? ? None  ? ?  ? ? ?  ?Arthor Captain, PA-C ?03/31/21 1323 ? ?  ?Jacalyn Lefevre, MD ?03/31/21 1402 ? ?

## 2021-10-08 DIAGNOSIS — F4320 Adjustment disorder, unspecified: Secondary | ICD-10-CM | POA: Diagnosis not present

## 2021-10-23 DIAGNOSIS — F4320 Adjustment disorder, unspecified: Secondary | ICD-10-CM | POA: Diagnosis not present

## 2021-11-20 DIAGNOSIS — F4323 Adjustment disorder with mixed anxiety and depressed mood: Secondary | ICD-10-CM | POA: Diagnosis not present

## 2022-01-12 ENCOUNTER — Ambulatory Visit (HOSPITAL_COMMUNITY)
Admission: EM | Admit: 2022-01-12 | Discharge: 2022-01-12 | Disposition: A | Discharge status: Home / Self Care | Payer: 59 | Attending: Family Medicine | Admitting: Family Medicine

## 2022-01-12 ENCOUNTER — Encounter (HOSPITAL_COMMUNITY): Payer: Self-pay | Admitting: Emergency Medicine

## 2022-01-12 DIAGNOSIS — J019 Acute sinusitis, unspecified: Secondary | ICD-10-CM | POA: Diagnosis not present

## 2022-01-12 DIAGNOSIS — J069 Acute upper respiratory infection, unspecified: Secondary | ICD-10-CM

## 2022-01-12 DIAGNOSIS — J4521 Mild intermittent asthma with (acute) exacerbation: Secondary | ICD-10-CM | POA: Diagnosis not present

## 2022-01-12 LAB — POCT RAPID STREP A, ED / UC: Streptococcus, Group A Screen (Direct): NEGATIVE

## 2022-01-12 MED ORDER — CEFDINIR 300 MG PO CAPS: 600.0000 mg | ORAL_CAPSULE | Freq: Every day | ORAL | 0 refills | Status: AC

## 2022-01-12 MED ORDER — BENZONATATE 100 MG PO CAPS: 100.0000 mg | ORAL_CAPSULE | Freq: Three times a day (TID) | ORAL | 0 refills | Status: AC | PRN

## 2022-01-12 MED ORDER — PREDNISONE 20 MG PO TABS: 40.0000 mg | ORAL_TABLET | Freq: Every day | ORAL | 0 refills | Status: AC

## 2022-01-12 NOTE — ED Provider Notes (Signed)
[facility]    CSN: [id number] Arrival date & time: 01/12/22  0847      History   Chief Complaint Chief Complaint  Patient presents with   Cough   Sore Throat   Headache    HPI Angela Stevenson is a 21 y.o. female.    Cough Associated symptoms: headaches   Sore Throat Associated symptoms include headaches.  Headache Associated symptoms: cough    Here with sore throat, cough, and congestion.  Symptoms began on December 14.  She developed more cough yesterday and she was sent home from work and had a PCR COVID test that was negative.  Is not had any fever and no vomiting or diarrhea  She does have a history of asthma and has noted some extra wheezing in the last 2 or 3 days.  Last menstrual cycle was earlier this month.   She does have some facial pressure and headache Past Medical History:  Diagnosis Date   Asthma    Asthma 2013   Patient reported   RA (rheumatoid arthritis) [facility])     Patient Active Problem List   Diagnosis Date Noted   History of rheumatoid arthritis 10/31/2018   Dizziness and giddiness 05/09/2018    Past Surgical History:  Procedure Laterality Date   BANKART REPAIR Right 12/23/2020   Procedure: ARTHROSCOPIC BANKART REPAIR;  Surgeon: Teryl Lucy, MD;  Location: [facility];  Service: Orthopedics;  Laterality: Right;    OB History     Gravida  0   Para  0   Term  0   Preterm  0   AB  0   Living  0      SAB  0   IAB  0   Ectopic  0   Multiple  0   Live Births  0            Home Medications    Prior to Admission medications   Medication Sig Start Date End Date Taking? Authorizing Provider  benzonatate (TESSALON) 100 MG capsule Take 1 capsule (100 mg total) by mouth 3 (three) times daily as needed for cough. 01/12/22  Yes Zenia Resides, MD  cefdinir (OMNICEF) 300 MG capsule Take 2 capsules (600 mg total) by mouth daily for 7 days. 01/12/22 01/19/22 Yes Jalyah Weinheimer, Janace Aris, MD   predniSONE (DELTASONE) 20 MG tablet Take 2 tablets (40 mg total) by mouth daily with breakfast for 5 days. 01/12/22 01/17/22 Yes Zenia Resides, MD  albuterol (VENTOLIN HFA) 108 (90 Base) MCG/ACT inhaler Inhale 2 puffs into the lungs every 6 (six) hours as needed for wheezing or shortness of breath. 02/20/20   Just, Azalee Course, FNP    Family History Family History  Problem Relation Age of Onset   Hypertension Father    Cancer Maternal Grandmother        lung    Cancer Paternal Grandmother    Cancer Paternal Grandfather     Social History Social History   Tobacco Use   Smoking status: Never   Smokeless tobacco: Never  Vaping Use   Vaping Use: Never used  Substance Use Topics   Alcohol use: Yes    Comment: occasionally   Drug use: No     Allergies   Celery oil and Mushroom extract complex   Review of Systems Review of Systems  Respiratory:  Positive for cough.   Neurological:  Positive for headaches.     Physical Exam Triage Vital Signs ED  Triage Vitals [01/12/22 1056]  Enc Vitals Group     BP 130/81     Pulse Rate 94     Resp 18     Temp 98.2 F (36.8 C)     Temp Source Oral     SpO2 95 %     Weight      Height      Head Circumference      Peak Flow      Pain Score 4     Pain Loc      Pain Edu?      Excl. in GC?    No data found.  Updated Vital Signs BP 130/81 (BP Location: Right Arm)   Pulse 94   Temp 98.2 F (36.8 C) (Oral)   Resp 18   SpO2 95%   Visual Acuity Right Eye Distance:   Left Eye Distance:   Bilateral Distance:    Right Eye Near:   Left Eye Near:    Bilateral Near:     Physical Exam Vitals reviewed.  Constitutional:      General: She is not in acute distress.    Appearance: She is not toxic-appearing.  HENT:     Right Ear: Tympanic membrane and ear canal normal.     Left Ear: Tympanic membrane and ear canal normal.     Nose: Nose normal.     Mouth/Throat:     Mouth: Mucous membranes are moist.     Comments:  Erythema of the posterior oropharynx.  Clear mucus is draining Eyes:     Extraocular Movements: Extraocular movements intact.     Conjunctiva/sclera: Conjunctivae normal.     Pupils: Pupils are equal, round, and reactive to light.  Cardiovascular:     Rate and Rhythm: Normal rate and regular rhythm.     Heart sounds: No murmur heard. Pulmonary:     Effort: Pulmonary effort is normal. No respiratory distress.     Breath sounds: No stridor. No wheezing, rhonchi or rales.  Musculoskeletal:     Cervical back: Neck supple.  Lymphadenopathy:     Cervical: No cervical adenopathy.  Skin:    Capillary Refill: Capillary refill takes less than 2 seconds.     Coloration: Skin is not jaundiced or pale.  Neurological:     General: No focal deficit present.     Mental Status: She is alert and oriented to person, place, and time.  Psychiatric:        Behavior: Behavior normal.      UC Treatments / Results  Labs (all labs ordered are listed, but only abnormal results are displayed) Labs Reviewed  POCT RAPID STREP A, ED / UC    EKG   Radiology No results found.  Procedures Procedures (including critical care time)  Medications Ordered in UC Medications - No data to display  Initial Impression / Assessment and Plan / UC Course  I have reviewed the triage vital signs and the nursing notes.  Pertinent labs & imaging results that were available during my care of the patient were reviewed by me and considered in my medical decision making (see chart for details).        Rapid strep is negative.  I am not going to send a throat culture, since I am going to go ahead and treat for acute sinusitis with her facial pressure and double sickening.  Also I am going to treat for asthma exacerbation. Final Clinical Impressions(s) / UC Diagnoses   Final diagnoses:  Viral URI with cough  Acute sinusitis, recurrence not specified, unspecified location  Mild intermittent asthma with acute  exacerbation     Discharge Instructions      The rapid strep test was negative  Take cefdinir 300 mg--2 capsules together daily for 7 days; this is for possible sinus infection  Take prednisone 20 mg--2 daily for 5 days  Take benzonatate 100 mg, 1 tab every 8 hours as needed for cough.        ED Prescriptions     Medication Sig Dispense Auth. Provider   cefdinir (OMNICEF) 300 MG capsule Take 2 capsules (600 mg total) by mouth daily for 7 days. 14 capsule Zenia Resides, MD   predniSONE (DELTASONE) 20 MG tablet Take 2 tablets (40 mg total) by mouth daily with breakfast for 5 days. 10 tablet Zenia Resides, MD   benzonatate (TESSALON) 100 MG capsule Take 1 capsule (100 mg total) by mouth 3 (three) times daily as needed for cough. 21 capsule Zenia Resides, MD      PDMP not reviewed this encounter.   Zenia Resides, MD 01/12/22 1143

## 2022-01-12 NOTE — Discharge Instructions (Signed)
The rapid strep test was negative  Take cefdinir 300 mg--2 capsules together daily for 7 days; this is for possible sinus infection  Take prednisone 20 mg--2 daily for 5 days  Take benzonatate 100 mg, 1 tab every 8 hours as needed for cough.

## 2022-01-12 NOTE — ED Triage Notes (Signed)
Pt reports nasal congestion, head pressure, bilateral ear pressure and a cough. States symptoms started Thursday with a sore throat and now has been coughing up mucous. PCR covid test was neg.

## 2022-01-28 ENCOUNTER — Ambulatory Visit: Payer: Commercial Managed Care - PPO | Admitting: Obstetrics & Gynecology

## 2022-03-26 ENCOUNTER — Ambulatory Visit: Payer: Commercial Managed Care - PPO | Admitting: Obstetrics & Gynecology
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
## 2022-06-22 ENCOUNTER — Emergency Department (HOSPITAL_COMMUNITY)
Admission: EM | Admit: 2022-06-22 | Discharge: 2022-06-22 | Disposition: A | Discharge status: Home / Self Care | Payer: 59 | Attending: Emergency Medicine | Admitting: Emergency Medicine

## 2022-06-22 ENCOUNTER — Other Ambulatory Visit: Payer: Self-pay

## 2022-06-22 ENCOUNTER — Emergency Department (HOSPITAL_COMMUNITY): Payer: 59

## 2022-06-22 ENCOUNTER — Encounter (HOSPITAL_COMMUNITY): Payer: Self-pay

## 2022-06-22 DIAGNOSIS — R002 Palpitations: Secondary | ICD-10-CM | POA: Insufficient documentation

## 2022-06-22 DIAGNOSIS — I4891 Unspecified atrial fibrillation: Secondary | ICD-10-CM | POA: Diagnosis not present

## 2022-06-22 LAB — BASIC METABOLIC PANEL
Anion gap: 6 (ref 5–15)
BUN: 12 mg/dL (ref 6–20)
CO2: 24 mmol/L (ref 22–32)
Calcium: 8.8 mg/dL — ABNORMAL LOW (ref 8.9–10.3)
Chloride: 104 mmol/L (ref 98–111)
Creatinine, Ser: 0.87 mg/dL (ref 0.44–1.00)
GFR, Estimated: >60 mL/min (ref >60)
Glucose, Bld: 80 mg/dL (ref 70–99)
Potassium: 3.6 mmol/L (ref 3.5–5.1)
Sodium: 134 mmol/L — ABNORMAL LOW (ref 135–145)

## 2022-06-22 LAB — CBC
HCT: 36.9 % (ref 36.0–46.0)
Hemoglobin: 12.6 g/dL (ref 12.0–15.0)
MCH: 28.8 pg (ref 26.0–34.0)
MCHC: 34.1 g/dL (ref 30.0–36.0)
MCV: 84.2 fL (ref 80.0–100.0)
Platelets: 308 10*3/uL (ref 150–400)
RBC: 4.38 MIL/uL (ref 3.87–5.11)
RDW: 12.3 % (ref 11.5–15.5)
WBC: 9.3 10*3/uL (ref 4.0–10.5)
nRBC: 0 % (ref 0.0–0.2)

## 2022-06-22 LAB — TSH: TSH: 0.884 u[IU]/mL (ref 0.350–4.500)

## 2022-06-22 LAB — TROPONIN I (HIGH SENSITIVITY)
Troponin I (High Sensitivity): <2 ng/L (ref <18)
Troponin I (High Sensitivity): 2 ng/L (ref <18)

## 2022-06-22 LAB — I-STAT BETA HCG BLOOD, ED (MC, WL, AP ONLY): I-stat hCG, quantitative: <5 m[IU]/mL (ref <5)

## 2022-06-22 NOTE — Discharge Instructions (Signed)
You were evaluated for palpitations, or funny heart beat. Your blood work all looked good. There was no evidence of any damage to the heart. Your heart looked like it was primarily beating normal. There was one occurrence of a pre-ventricular contraction. These are not uncommon and are most often from stress, caffeine, alcohol. One occurrence is a small amount and is not dangerous. You should limit these things to avoid further palpitations. We have referred you for a primary care follow up. We have also referred you for cardiology follow up to see if you need a heart monitor for a few days or weeks. Please return to the ED if you have loss of consciousness or feeling you will pass out, light headedness/dizziness, CP, or inability to breath associated with your palpitations. It was a pleasure caring for you.

## 2022-06-22 NOTE — ED Triage Notes (Signed)
Pt c/o palpitations, states she normally has once every few months; more consistent over last several weeks; denies pain, denies sob, denies N/V; denies cardiac hx, denies drug use; states she drinks 1 glass of wine/day

## 2022-06-22 NOTE — ED Provider Notes (Signed)
[place] EMERGENCY DEPARTMENT AT [facility] Provider Note   CSN: [id number] Arrival date & time: 06/22/22  1249     History  Chief Complaint  Patient presents with   Palpitations    Angela Stevenson is a 22 y.o. female.   The patient endorse years of palpitations, feels like they are slow, irregular, prominent and radiating to the neck.Initially they only lasted for a few minutes every few months. They have now  been present for the past 3 days.Anytime she gets up the palpitations come one and they go away with rest.Denies lightheadedness/dizziness, vision changes, loc, CP, SOB,LE swelling.Denies heat or cold intolerance, diarrhea or constipation, weight changes, brittle nails.She does endorse anxiety and is currently between jobs right now. 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 Denies tobacco or other substance use.    Palpitations      Home Medications Prior to Admission medications   Medication Sig Start Date End Date Taking? Authorizing Provider  albuterol (VENTOLIN HFA) 108 (90 Base) MCG/ACT inhaler Inhale 2 puffs into the lungs every 6 (six) hours as needed for wheezing or shortness of breath. 02/20/20   Just, Azalee Course, FNP  benzonatate (TESSALON) 100 MG capsule Take 1 capsule (100 mg total) by mouth 3 (three) times daily as needed for cough. 01/12/22   Zenia Resides, MD      Allergies    Celery oil and Mushroom extract complex    Review of Systems   Review of Systems  Cardiovascular:  Positive for palpitations.  All other systems reviewed and are negative.   Physical Exam Updated Vital Signs BP 131/86 (BP Location: Right Arm)   Pulse 87   Temp 99 F (37.2 C)   Resp 16   Ht 5'  7" (1.702 m)   Wt 66.7 kg   LMP 05/13/2022 (Approximate)   SpO2 98%   BMI 23.02 kg/m  Physical Exam Constitutional:      General: She is not in acute distress.    Appearance: Normal appearance. She is normal weight. She is not ill-appearing or diaphoretic.  Eyes:     Conjunctiva/sclera: Conjunctivae normal.  Cardiovascular:     Rate and Rhythm: Normal rate and regular rhythm.     Pulses: Normal pulses.     Heart sounds: Normal heart sounds. No murmur heard.    No friction rub. No gallop.  Pulmonary:     Effort: Pulmonary effort is normal. No respiratory distress.     Breath sounds: Normal breath sounds. No wheezing or rales.  Musculoskeletal:     Right lower leg: No edema.     Left lower leg: No edema.  Skin:    General: Skin is warm and dry.     Capillary Refill: Capillary refill takes less than 2 seconds.  Neurological:     Mental Status: She is alert.     ED Results / Procedures / Treatments   Labs (all labs ordered are listed, but only abnormal results are displayed) Labs Reviewed  BASIC METABOLIC PANEL - Abnormal; Notable for the following components:      Result Value   Sodium 134 (*)    Calcium 8.8 (*)    All other components within normal limits  CBC  I-STAT BETA HCG BLOOD,  ED ([facility], [facility], [facility] ONLY)  TROPONIN I (HIGH SENSITIVITY)  TROPONIN I (HIGH SENSITIVITY)    EKG EKG Interpretation  Date/Time:  Tuesday Jun 22 2022 12:43:58 EDT Ventricular Rate:  82 PR Interval:  144 QRS Duration: 82 QT Interval:  350 QTC Calculation: 408 R Axis:   65 Text Interpretation: Sinus rhythm with Premature supraventricular complexes Otherwise normal ECG When compared with ECG of 11-May-2018 20:56, PREVIOUS ECG IS PRESENT PVCs are new Confirmed by Jacalyn Lefevre [phone]) on 06/22/2022 4:22:45 PM  Radiology DG Chest 1 View  Result Date: 06/22/2022 CLINICAL DATA:  Atrial fibrillation.  Palpitations. EXAM: CHEST  1 VIEW COMPARISON:  Chest radiographs 10/31/2018 FINDINGS: The  cardiomediastinal silhouette is unchanged with normal heart size. No airspace consolidation, edema, pleural effusion, or pneumothorax is identified. No acute osseous abnormality is seen. IMPRESSION: No active disease. Electronically Signed   By: Sebastian Ache M.D.   On: 06/22/2022 13:57    Procedures Procedures    Medications Ordered in ED Medications - No data to display  ED Course/ Medical Decision Making/ A&P                             Medical Decision Making Patient presents for palpitations that woke her up from sleep and have been more persistent over the past few days despite decreasing alcohol and caffeine intake. CBC without anemia or other abnormalities, bmp without electrolyte or other abnormalities, troponin wnl, TSH wnl. CXR with no cardiopulmonary process. EKG NSR other than 1 PVC. Given no CP, dyspnea, hypoxia, LOC, vision changes or other high risk features, suspect her palpitations are from PVCs. She can discharge given no evidence of brady or tachy arrhythmia, no ischemia, not symptomatic.Patient counseled on minimizing alcohol, caffeine/substance use, and also getting a pcp to manage anxiety. Also discussed cardiology referral for potential cardiac monitoring outpatient  Amount and/or Complexity of Data Reviewed Labs: ordered.    Final Clinical Impression(s) / ED Diagnoses Final diagnoses:  None    Rx / DC Orders ED Discharge Orders     None         Willette Cluster, MD 06/22/22 1743    Jacalyn Lefevre, MD 06/22/22 2001

## 2023-01-31 ENCOUNTER — Encounter (HOSPITAL_BASED_OUTPATIENT_CLINIC_OR_DEPARTMENT_OTHER): Payer: Self-pay | Admitting: Emergency Medicine

## 2023-01-31 ENCOUNTER — Other Ambulatory Visit (HOSPITAL_BASED_OUTPATIENT_CLINIC_OR_DEPARTMENT_OTHER): Payer: Self-pay

## 2023-01-31 ENCOUNTER — Emergency Department (HOSPITAL_BASED_OUTPATIENT_CLINIC_OR_DEPARTMENT_OTHER): Payer: Self-pay

## 2023-01-31 ENCOUNTER — Emergency Department (HOSPITAL_BASED_OUTPATIENT_CLINIC_OR_DEPARTMENT_OTHER)
Admission: EM | Admit: 2023-01-31 | Discharge: 2023-01-31 | Disposition: A | Discharge status: Home / Self Care | Payer: Self-pay | Attending: Emergency Medicine | Admitting: Emergency Medicine

## 2023-01-31 DIAGNOSIS — J45909 Unspecified asthma, uncomplicated: Secondary | ICD-10-CM | POA: Insufficient documentation

## 2023-01-31 DIAGNOSIS — Z789 Other specified health status: Secondary | ICD-10-CM

## 2023-01-31 DIAGNOSIS — N3000 Acute cystitis without hematuria: Secondary | ICD-10-CM | POA: Insufficient documentation

## 2023-01-31 DIAGNOSIS — R109 Unspecified abdominal pain: Secondary | ICD-10-CM

## 2023-01-31 DIAGNOSIS — Y909 Presence of alcohol in blood, level not specified: Secondary | ICD-10-CM | POA: Insufficient documentation

## 2023-01-31 DIAGNOSIS — F1090 Alcohol use, unspecified, uncomplicated: Secondary | ICD-10-CM | POA: Insufficient documentation

## 2023-01-31 LAB — URINALYSIS, ROUTINE W REFLEX MICROSCOPIC
Bilirubin Urine: NEGATIVE
Glucose, UA: NEGATIVE mg/dL
Hgb urine dipstick: NEGATIVE
Ketones, ur: NEGATIVE mg/dL
Leukocytes,Ua: NEGATIVE
Nitrite: POSITIVE — AB
Protein, ur: NEGATIVE mg/dL
Specific Gravity, Urine: 1.013 (ref 1.005–1.030)
pH: 6.5 (ref 5.0–8.0)

## 2023-01-31 LAB — COMPREHENSIVE METABOLIC PANEL
ALT: 11 U/L (ref 0–44)
AST: 12 U/L — ABNORMAL LOW (ref 15–41)
Albumin: 4.2 g/dL (ref 3.5–5.0)
Alkaline Phosphatase: 57 U/L (ref 38–126)
Anion gap: 4 — ABNORMAL LOW (ref 5–15)
BUN: 14 mg/dL (ref 6–20)
CO2: 29 mmol/L (ref 22–32)
Calcium: 8.6 mg/dL — ABNORMAL LOW (ref 8.9–10.3)
Chloride: 103 mmol/L (ref 98–111)
Creatinine, Ser: 0.93 mg/dL (ref 0.44–1.00)
GFR, Estimated: >60 mL/min (ref >60)
Glucose, Bld: 66 mg/dL — ABNORMAL LOW (ref 70–99)
Potassium: 4.1 mmol/L (ref 3.5–5.1)
Sodium: 136 mmol/L (ref 135–145)
Total Bilirubin: 0.4 mg/dL (ref 0.0–1.2)
Total Protein: 7.7 g/dL (ref 6.5–8.1)

## 2023-01-31 LAB — CBC WITH DIFFERENTIAL/PLATELET
Abs Immature Granulocytes: 0.04 10*3/uL (ref 0.00–0.07)
Basophils Absolute: 0.1 10*3/uL (ref 0.0–0.1)
Basophils Relative: 1 %
Eosinophils Absolute: 0.5 10*3/uL (ref 0.0–0.5)
Eosinophils Relative: 5 %
HCT: 38.7 % (ref 36.0–46.0)
Hemoglobin: 13.3 g/dL (ref 12.0–15.0)
Immature Granulocytes: 1 %
Lymphocytes Relative: 28 %
Lymphs Abs: 2.4 10*3/uL (ref 0.7–4.0)
MCH: 28.7 pg (ref 26.0–34.0)
MCHC: 34.4 g/dL (ref 30.0–36.0)
MCV: 83.4 fL (ref 80.0–100.0)
Monocytes Absolute: 0.6 10*3/uL (ref 0.1–1.0)
Monocytes Relative: 7 %
Neutro Abs: 4.9 10*3/uL (ref 1.7–7.7)
Neutrophils Relative %: 58 %
Platelets: 335 10*3/uL (ref 150–400)
RBC: 4.64 MIL/uL (ref 3.87–5.11)
RDW: 12.3 % (ref 11.5–15.5)
WBC: 8.5 10*3/uL (ref 4.0–10.5)
nRBC: 0 % (ref 0.0–0.2)

## 2023-01-31 LAB — PREGNANCY, URINE: Preg Test, Ur: NEGATIVE

## 2023-01-31 LAB — LIPASE, BLOOD: Lipase: 15 U/L (ref 11–51)

## 2023-01-31 MED ORDER — CEPHALEXIN 500 MG PO CAPS
500.0000 mg | ORAL_CAPSULE | Freq: Three times a day (TID) | ORAL | 0 refills | Status: AC
  Filled 2023-01-31: qty 30, 10d supply, fill #0

## 2023-01-31 MED ORDER — IOHEXOL 300 MG/ML  SOLN
100.0000 mL | Freq: Once | INTRAMUSCULAR | Status: AC | PRN
  Administered 2023-01-31: 85 mL via INTRAVENOUS

## 2023-01-31 MED ORDER — KETOROLAC TROMETHAMINE 15 MG/ML IJ SOLN
15.0000 mg | Freq: Once | INTRAMUSCULAR | Status: AC
  Administered 2023-01-31: 15 mg via INTRAVENOUS
  Filled 2023-01-31: qty 1

## 2023-01-31 NOTE — ED Notes (Signed)
 Patient transported to CT

## 2023-01-31 NOTE — ED Provider Notes (Signed)
 [place] EMERGENCY DEPARTMENT AT [facility] Provider Note  CSN: [id number] Arrival date & time: 01/31/23 9276  Chief Complaint(s) Flank Pain  HPI Angela Stevenson is a 23 y.o. female with past medical history as below, significant for asthma, RA who presents to the ED with complaint of flank pain, possible UTI  She has been having the left-sided flank pain intermittently over the past month, she feels it is worsened with alcohol use.  She binge drinks every few days, and reports that she likes to party.  Denies daily alcohol use, denies history of alcohol withdrawal or seizure.  No illicit drug use in conjunction with the alcohol.  Left-sided flank pain is aching, cramping sensation, radiates to her left lower quadrant.  She also been having increased urine frequency, malodorous urine.  No vaginal discharge or bleeding that seems abnormal.  She had some nausea without vomiting.  No fevers or chills.  She has been taking Tylenol  for the pain with mild improvement.  Past Medical History Past Medical History:  Diagnosis Date   Asthma    Asthma 2013   Patient reported   RA (rheumatoid arthritis) (HCC)    Patient Active Problem List   Diagnosis Date Noted   History of rheumatoid arthritis 10/31/2018   Dizziness and giddiness 05/09/2018   Home Medication(s) Prior to Admission medications   Medication Sig Start Date End Date Taking? Authorizing Provider  cephALEXin  (KEFLEX ) 500 MG capsule Take 1 capsule (500 mg total) by mouth 3 (three) times daily for 10 days. 01/31/23 02/10/23 Yes Elnor Jayson LABOR, DO  albuterol  (VENTOLIN  HFA) 108 (90 Base) MCG/ACT inhaler Inhale 2 puffs into the lungs every 6 (six) hours as needed for wheezing or shortness of breath. 02/20/20   Just, Kelsea J, FNP  benzonatate  (TESSALON ) 100 MG capsule Take 1 capsule (100 mg total) by mouth 3 (three) times daily as needed for cough. 01/12/22   Vonna Sharlet POUR, MD                                                                                                                                     Past Surgical History Past Surgical History:  Procedure Laterality Date   BANKART REPAIR Right 12/23/2020   Procedure: ARTHROSCOPIC BANKART REPAIR;  Surgeon: Josefina Chew, MD;  Location: [facility];  Service: Orthopedics;  Laterality: Right;   Family History Family History  Problem Relation Age of Onset   Hypertension Father    Cancer Maternal Grandmother        lung    Cancer Paternal Grandmother    Cancer Paternal Grandfather     Social History Social History   Tobacco Use   Smoking status: Never   Smokeless tobacco: Never  Vaping Use   Vaping status: Never Used  Substance Use Topics   Alcohol use: Yes    Comment: 1 glass of wine/day   Drug use: No  Allergies Celery oil and Mushroom extract complex (do not select)  Review of Systems Review of Systems  Constitutional:  Negative for chills and fever.  HENT:  Negative for congestion.   Respiratory:  Negative for chest tightness and shortness of breath.   Cardiovascular:  Negative for chest pain.  Gastrointestinal:  Positive for abdominal pain and nausea. Negative for vomiting.  Genitourinary:  Positive for flank pain, frequency and urgency.  Skin:  Negative for color change, pallor and rash.  Neurological:  Negative for syncope and light-headedness.  All other systems reviewed and are negative.   Physical Exam Vital Signs  I have reviewed the triage vital signs BP 133/72   Pulse 82   Temp 97.9 F (36.6 C)   Resp 18   Wt 83.9 kg   LMP 01/10/2023   SpO2 100%   BMI 28.98 kg/m  Physical Exam Vitals and nursing note reviewed.  Constitutional:      General: She is not in acute distress.    Appearance: Normal appearance.  HENT:     Head: Normocephalic and atraumatic.     Right Ear: External ear normal.     Left Ear: External ear normal.     Nose: Nose normal.     Mouth/Throat:     Mouth: Mucous membranes are  moist.  Eyes:     General: No scleral icterus.       Right eye: No discharge.        Left eye: No discharge.  Cardiovascular:     Rate and Rhythm: Normal rate and regular rhythm.     Pulses: Normal pulses.     Heart sounds: Normal heart sounds.  Pulmonary:     Effort: Pulmonary effort is normal. No respiratory distress.     Breath sounds: Normal breath sounds. No stridor.  Abdominal:     General: Abdomen is flat. There is no distension.     Palpations: Abdomen is soft.     Tenderness: There is abdominal tenderness in the left upper quadrant. There is no guarding or rebound.       Comments: LUQ/left flank   Musculoskeletal:     Cervical back: No rigidity.     Right lower leg: No edema.     Left lower leg: No edema.  Skin:    General: Skin is warm and dry.     Capillary Refill: Capillary refill takes less than 2 seconds.  Neurological:     Mental Status: She is alert.  Psychiatric:        Mood and Affect: Mood normal.        Behavior: Behavior normal. Behavior is cooperative.     ED Results and Treatments Labs (all labs ordered are listed, but only abnormal results are displayed) Labs Reviewed  URINALYSIS, ROUTINE W REFLEX MICROSCOPIC - Abnormal; Notable for the following components:      Result Value   Nitrite POSITIVE (*)    Bacteria, UA MANY (*)    All other components within normal limits  COMPREHENSIVE METABOLIC PANEL - Abnormal; Notable for the following components:   Glucose, Bld 66 (*)    Calcium 8.6 (*)    AST 12 (*)    Anion gap 4 (*)    All other components within normal limits  URINE CULTURE  PREGNANCY, URINE  CBC WITH DIFFERENTIAL/PLATELET  LIPASE, BLOOD  Radiology CT ABDOMEN PELVIS W CONTRAST Result Date: 01/31/2023 CLINICAL DATA:  LUQ abdominal pain. EXAM: CT ABDOMEN AND PELVIS WITH CONTRAST TECHNIQUE: Multidetector CT imaging of  the abdomen and pelvis was performed using the standard protocol following bolus administration of intravenous contrast. RADIATION DOSE REDUCTION: This exam was performed according to the departmental dose-optimization program which includes automated exposure control, adjustment of the mA and/or kV according to patient size and/or use of iterative reconstruction technique. CONTRAST:  85mL OMNIPAQUE  IOHEXOL  300 MG/ML  SOLN COMPARISON:  None Available. FINDINGS: Lower chest: The lung bases are clear. No pleural effusion. The heart is normal in size. No pericardial effusion. Hepatobiliary: The liver is normal in size. Non-cirrhotic configuration. No suspicious mass. There are 2, sub 5 mm, hypoattenuating foci, which are too small to adequately characterize. No intrahepatic or extrahepatic bile duct dilation. No calcified gallstones. Normal gallbladder wall thickness. No pericholecystic inflammatory changes. Pancreas: Unremarkable. No pancreatic ductal dilatation or surrounding inflammatory changes. Spleen: Within normal limits. No focal lesion. Adrenals/Urinary Tract: Adrenal glands are unremarkable. No suspicious renal mass. No hydronephrosis. No renal or ureteric calculi. Unremarkable urinary bladder. Stomach/Bowel: No disproportionate dilation of the small or large bowel loops. No evidence of abnormal bowel wall thickening or inflammatory changes. The appendix is unremarkable. Vascular/Lymphatic: There is trace amount of free fluid in the dependent pelvis, likely physiological in the patient of this age group. No pneumoperitoneum. No abdominal or pelvic lymphadenopathy, by size criteria. No aneurysmal dilation of the major abdominal arteries. Reproductive: The uterus is unremarkable. The ovaries are unremarkable. Other: There is a tiny fat containing umbilical hernia. The soft tissues and abdominal wall are otherwise unremarkable. Musculoskeletal: No suspicious osseous lesions. IMPRESSION: *No acute inflammatory  process identified within the abdomen or pelvis. No nephroureterolithiasis or obstructive uropathy. *Multiple other nonacute observations, as described above. Electronically Signed   By: Ree Molt M.D.   On: 01/31/2023 09:57    Pertinent labs & imaging results that were available during my care of the patient were reviewed by me and considered in my medical decision making (see MDM for details).  Medications Ordered in ED Medications  ketorolac  (TORADOL ) 15 MG/ML injection 15 mg (15 mg Intravenous Given 01/31/23 0857)  iohexol  (OMNIPAQUE ) 300 MG/ML solution 100 mL (85 mLs Intravenous Contrast Given 01/31/23 0926)                                                                                                                                     Procedures Procedures  (including critical care time)  Medical Decision Making / ED Course    Medical Decision Making:    NERY KALISZ is a 23 y.o. female with past medical history as below, significant for asthma, RA who presents to the ED with complaint of flank pain, possible UTI. The complaint involves an extensive differential diagnosis and also carries with it a high risk of complications and morbidity.  Serious  etiology was considered. Ddx includes but is not limited to: Differential diagnosis includes but is not exclusive to ectopic pregnancy, ovarian cyst, ovarian torsion, acute appendicitis, urinary tract infection, endometriosis, bowel obstruction, hernia, colitis, renal colic, gastroenteritis, volvulus etc.   Complete initial physical exam performed, notably the patient was in no distress, no hypoxia.    Reviewed and confirmed nursing documentation for past medical history, family history, social history.  Vital signs reviewed.      Clinical Course as of 01/31/23 1048  Mon Jan 31, 2023  9145 Preg Test, Ur: NEGATIVE [SG]  [phone] Nitrite(!): POSITIVE [SG]  0854 WBC, UA: 6-10 [SG]  0854 Bacteria, UA(!): MANY Send urine culture,  likely UTI given she is symptomatic [SG]    Clinical Course User Index [SG] Elnor Jayson LABOR, DO    Brief summary: 23 year old female history as above including frequent alcohol use here with left-sided flank pain, concern for UTI.  Will collect screening labs, give analgesia, consider CT abd pending labs.  Encouraged alcohol cessation  Imaging stable, labs reveal UTI.  Not septic, does not appear to have pyelonephritis.  Tolerant p.o., no fever. Stable for o/p management at this time. Encouraged pcp f/u  The patient improved significantly and was discharged in stable condition. Detailed discussions were had with the patient regarding current findings, and need for close f/u with PCP or on call doctor. The patient has been instructed to return immediately if the symptoms worsen in any way for re-evaluation. Patient verbalized understanding and is in agreement with current care plan. All questions answered prior to discharge.              Additional history obtained: -Additional history obtained from na -External records from outside source obtained and reviewed including: Chart review including previous notes, labs, imaging, consultation notes including  Prior ED visits, home medications, prior labs and imaging   Lab Tests: -I ordered, reviewed, and interpreted labs.   The pertinent results include:   Labs Reviewed  URINALYSIS, ROUTINE W REFLEX MICROSCOPIC - Abnormal; Notable for the following components:      Result Value   Nitrite POSITIVE (*)    Bacteria, UA MANY (*)    All other components within normal limits  COMPREHENSIVE METABOLIC PANEL - Abnormal; Notable for the following components:   Glucose, Bld 66 (*)    Calcium 8.6 (*)    AST 12 (*)    Anion gap 4 (*)    All other components within normal limits  URINE CULTURE  PREGNANCY, URINE  CBC WITH DIFFERENTIAL/PLATELET  LIPASE, BLOOD    Notable for UTI  EKG   EKG Interpretation Date/Time:    Ventricular  Rate:    PR Interval:    QRS Duration:    QT Interval:    QTC Calculation:   R Axis:      Text Interpretation:           Imaging Studies ordered: I ordered imaging studies including CT abdomen I independently visualized the following imaging with scope of interpretation limited to determining acute life threatening conditions related to emergency care; findings noted above I independently visualized and interpreted imaging. I agree with the radiologist interpretation   Medicines ordered and prescription drug management: Meds ordered this encounter  Medications   ketorolac  (TORADOL ) 15 MG/ML injection 15 mg   iohexol  (OMNIPAQUE ) 300 MG/ML solution 100 mL   cephALEXin  (KEFLEX ) 500 MG capsule    Sig: Take 1 capsule (500 mg total) by mouth 3 (three) times  daily for 10 days.    Dispense:  30 capsule    Refill:  0    -I have reviewed the patients home medicines and have made adjustments as needed   Consultations Obtained: na   Cardiac Monitoring: Continuous pulse oximetry interpreted by myself, 100% on RA.    Social Determinants of Health:  Diagnosis or treatment significantly limited by social determinants of health: alcohol use no pcp (given pcp f/u instructions)   Reevaluation: After the interventions noted above, I reevaluated the patient and found that they have improved  Co morbidities that complicate the patient evaluation  Past Medical History:  Diagnosis Date   Asthma    Asthma 2013   Patient reported   RA (rheumatoid arthritis) (HCC)       Dispostion: Disposition decision including need for hospitalization was considered, and patient discharged from emergency department.    Final Clinical Impression(s) / ED Diagnoses Final diagnoses:  Acute cystitis without hematuria  Abdominal pain, unspecified abdominal location  Alcohol use        Elnor Jayson LABOR, DO 01/31/23 1048

## 2023-01-31 NOTE — ED Notes (Signed)
 Spoke with lab to add on urine culture.

## 2023-01-31 NOTE — Discharge Instructions (Addendum)
 It was a pleasure caring for you today in the emergency department.  Drink plenty of fluids over the next few days.  Cut back on alcohol use.   Please return to the emergency department for any worsening or worrisome symptoms including but not limited to, vomiting, fevers, worsening discomfort.

## 2023-01-31 NOTE — ED Notes (Signed)
 ED Provider at bedside.

## 2023-01-31 NOTE — ED Notes (Signed)
 Given orange juice for low blood surgar

## 2023-01-31 NOTE — ED Triage Notes (Signed)
 Pt c/o LT flank pain, urinary frequency "for a few weeks". Denies fever. Pt requests pregnancy test

## 2023-02-02 LAB — URINE CULTURE: Culture: >=100000 — AB

## 2023-06-24 ENCOUNTER — Other Ambulatory Visit (HOSPITAL_COMMUNITY): Payer: Self-pay

## 2023-06-24 MED ORDER — PENICILLIN V POTASSIUM 500 MG PO TABS
500.0000 mg | ORAL_TABLET | Freq: Four times a day (QID) | ORAL | 0 refills | Status: AC
  Filled 2023-06-24: qty 56, 14d supply, fill #0

## 2023-06-24 MED ORDER — CHLORHEXIDINE GLUCONATE 0.12 % MT SOLN
OROMUCOSAL | 2 refills | Status: AC
  Filled 2023-06-24: qty 473, 14d supply, fill #0

## 2023-06-24 MED ORDER — DOXYCYCLINE HYCLATE 100 MG PO CAPS
100.0000 mg | ORAL_CAPSULE | Freq: Two times a day (BID) | ORAL | 0 refills | Status: AC
  Filled 2023-06-24: qty 28, 14d supply, fill #0

## 2023-06-24 MED ORDER — IBUPROFEN 800 MG PO TABS
800.0000 mg | ORAL_TABLET | Freq: Four times a day (QID) | ORAL | 0 refills | Status: AC | PRN
  Filled 2023-06-24: qty 20, 5d supply, fill #0

## 2023-06-29 ENCOUNTER — Other Ambulatory Visit: Payer: Self-pay

## 2023-06-29 ENCOUNTER — Other Ambulatory Visit (HOSPITAL_COMMUNITY): Payer: Self-pay

## 2023-06-29 MED ORDER — HYDROCODONE-ACETAMINOPHEN 5-325 MG PO TABS
1.0000 | ORAL_TABLET | Freq: Four times a day (QID) | ORAL | 0 refills | Status: AC | PRN
  Filled 2023-06-29: qty 10, 3d supply, fill #0
